# Patient Record
Sex: Male | Born: 1965
Health system: Southern US, Community
[De-identification: ages and names within clinical notes are randomized; demographics above are authoritative.]

## PROBLEM LIST (undated history)

## (undated) DIAGNOSIS — Z789 Other specified health status: Secondary | ICD-10-CM

## (undated) DIAGNOSIS — F319 Bipolar disorder, unspecified: Secondary | ICD-10-CM

## (undated) DIAGNOSIS — F209 Schizophrenia, unspecified: Secondary | ICD-10-CM

## (undated) HISTORY — PX: TONSILLECTOMY: SUR1361

## (undated) HISTORY — PX: MOUTH SURGERY: SHX715

## (undated) HISTORY — DX: Other specified health status: Z78.9

---

## 1997-11-27 ENCOUNTER — Emergency Department (HOSPITAL_COMMUNITY): Admission: EM | Admit: 1997-11-27 | Discharge: 1997-11-27 | Payer: Self-pay | Admitting: Emergency Medicine

## 1998-02-04 ENCOUNTER — Emergency Department (HOSPITAL_COMMUNITY): Admission: EM | Admit: 1998-02-04 | Discharge: 1998-02-04 | Payer: Self-pay | Admitting: Emergency Medicine

## 1998-05-14 ENCOUNTER — Encounter: Payer: Self-pay | Admitting: Emergency Medicine

## 1998-05-14 ENCOUNTER — Emergency Department (HOSPITAL_COMMUNITY): Admission: EM | Admit: 1998-05-14 | Discharge: 1998-05-14 | Payer: Self-pay | Admitting: Emergency Medicine

## 1998-07-01 ENCOUNTER — Emergency Department (HOSPITAL_COMMUNITY): Admission: EM | Admit: 1998-07-01 | Discharge: 1998-07-01 | Payer: Self-pay | Admitting: Emergency Medicine

## 1998-08-05 ENCOUNTER — Emergency Department (HOSPITAL_COMMUNITY): Admission: EM | Admit: 1998-08-05 | Discharge: 1998-08-05 | Payer: Self-pay | Admitting: Emergency Medicine

## 1998-08-05 ENCOUNTER — Encounter: Payer: Self-pay | Admitting: Emergency Medicine

## 1998-08-15 ENCOUNTER — Emergency Department (HOSPITAL_COMMUNITY): Admission: EM | Admit: 1998-08-15 | Discharge: 1998-08-15 | Payer: Self-pay | Admitting: Emergency Medicine

## 1998-08-22 ENCOUNTER — Emergency Department (HOSPITAL_COMMUNITY): Admission: EM | Admit: 1998-08-22 | Discharge: 1998-08-22 | Payer: Self-pay | Admitting: Emergency Medicine

## 1998-10-25 ENCOUNTER — Emergency Department (HOSPITAL_COMMUNITY): Admission: EM | Admit: 1998-10-25 | Discharge: 1998-10-25 | Payer: Self-pay | Admitting: Emergency Medicine

## 1998-12-16 ENCOUNTER — Emergency Department (HOSPITAL_COMMUNITY): Admission: EM | Admit: 1998-12-16 | Discharge: 1998-12-16 | Payer: Self-pay | Admitting: Emergency Medicine

## 1999-09-07 ENCOUNTER — Emergency Department (HOSPITAL_COMMUNITY): Admission: EM | Admit: 1999-09-07 | Discharge: 1999-09-07 | Payer: Self-pay | Admitting: Emergency Medicine

## 1999-10-08 ENCOUNTER — Emergency Department (HOSPITAL_COMMUNITY): Admission: EM | Admit: 1999-10-08 | Discharge: 1999-10-08 | Payer: Self-pay | Admitting: Emergency Medicine

## 1999-10-08 ENCOUNTER — Encounter: Payer: Self-pay | Admitting: Emergency Medicine

## 2000-01-30 ENCOUNTER — Emergency Department (HOSPITAL_COMMUNITY): Admission: EM | Admit: 2000-01-30 | Discharge: 2000-01-30 | Payer: Self-pay | Admitting: Emergency Medicine

## 2000-01-30 ENCOUNTER — Encounter: Payer: Self-pay | Admitting: Emergency Medicine

## 2000-02-05 ENCOUNTER — Inpatient Hospital Stay (HOSPITAL_COMMUNITY): Admission: EM | Admit: 2000-02-05 | Discharge: 2000-02-11 | Payer: Self-pay | Admitting: *Deleted

## 2000-07-15 ENCOUNTER — Emergency Department (HOSPITAL_COMMUNITY): Admission: EM | Admit: 2000-07-15 | Discharge: 2000-07-15 | Payer: Self-pay | Admitting: Emergency Medicine

## 2000-10-04 ENCOUNTER — Emergency Department (HOSPITAL_COMMUNITY): Admission: EM | Admit: 2000-10-04 | Discharge: 2000-10-04 | Payer: Self-pay | Admitting: Emergency Medicine

## 2001-07-07 ENCOUNTER — Emergency Department (HOSPITAL_COMMUNITY): Admission: EM | Admit: 2001-07-07 | Discharge: 2001-07-08 | Payer: Self-pay

## 2002-02-08 ENCOUNTER — Emergency Department (HOSPITAL_COMMUNITY): Admission: EM | Admit: 2002-02-08 | Discharge: 2002-02-08 | Payer: Self-pay | Admitting: Emergency Medicine

## 2005-02-11 ENCOUNTER — Emergency Department (HOSPITAL_COMMUNITY): Admission: EM | Admit: 2005-02-11 | Discharge: 2005-02-11 | Payer: Self-pay | Admitting: Emergency Medicine

## 2005-08-18 ENCOUNTER — Emergency Department (HOSPITAL_COMMUNITY): Admission: EM | Admit: 2005-08-18 | Discharge: 2005-08-18 | Payer: Self-pay | Admitting: Emergency Medicine

## 2006-08-25 ENCOUNTER — Emergency Department (HOSPITAL_COMMUNITY): Admission: EM | Admit: 2006-08-25 | Discharge: 2006-08-25 | Payer: Self-pay | Admitting: Emergency Medicine

## 2006-10-18 ENCOUNTER — Emergency Department (HOSPITAL_COMMUNITY): Admission: EM | Admit: 2006-10-18 | Discharge: 2006-10-18 | Payer: Self-pay | Admitting: Emergency Medicine

## 2007-06-05 ENCOUNTER — Emergency Department (HOSPITAL_COMMUNITY): Admission: EM | Admit: 2007-06-05 | Discharge: 2007-06-05 | Payer: Self-pay | Admitting: Emergency Medicine

## 2008-05-16 ENCOUNTER — Emergency Department (HOSPITAL_COMMUNITY): Admission: EM | Admit: 2008-05-16 | Discharge: 2008-05-16 | Payer: Self-pay | Admitting: Emergency Medicine

## 2008-05-20 ENCOUNTER — Emergency Department (HOSPITAL_COMMUNITY): Admission: EM | Admit: 2008-05-20 | Discharge: 2008-05-20 | Payer: Self-pay | Admitting: Emergency Medicine

## 2008-07-01 ENCOUNTER — Emergency Department (HOSPITAL_COMMUNITY): Admission: EM | Admit: 2008-07-01 | Discharge: 2008-07-01 | Payer: Self-pay | Admitting: Emergency Medicine

## 2010-10-15 LAB — GLUCOSE, CAPILLARY: Glucose-Capillary: 132 mg/dL — ABNORMAL HIGH (ref 70–99)

## 2010-11-16 NOTE — H&P (Signed)
[facility]  Patient:    Blake Gray                        MRN: [id number] Adm. Date:  [id number] Attending:  Denny Gray Dictator:   Blake Gray, N.P.                         History and Physical  IDENTIFYING INFORMATION:  [name] is a 45 year old African-American single male admitted under voluntary admission, February 05, 2000, on referral by [facility] for using cocaine, alcohol and also having command hallucinations.  HISTORY OF PRESENT ILLNESS:  Patient apparently is treated for paranoid schizophrenia at the [facility].  Patient has been noncompliant with his Haldol D injections.  His last shot, as noted on the chart, was May 2001.  He reports that he was having command hallucinations telling him to jump off a bridge, to steal or to get one, meaning to get more crack cocaine.  He states he currently is not suicidal or homicidal.  He last heard the voices early this morning.  He reports he is able to contract for safety and get a nurse, should he need one.  He states his sleep has been fairly good.  Appetite:  He eats once a day.  Weight loss of 10 pounds in the last month.  He states he is walking a lot and has been using crack cocaine. Decreased energy.  He used $400 worth of crack cocaine on this past Friday. He uses cocaine every day; he last used February 02, 2000.  He also drinks a 40-ounce beer every day and this has been going on for 3 to 4 months; his last drink was Monday.  He has no history of DTs, no history of seizures, no history of blackouts.  Again, he last heard voices this morning.  He denies current paranoia.  He states that "the voices do know my mind."  PAST PSYCHIATRIC HISTORY:  Patient goes to the [facility] and sees Blake Gray and Blake Gray.  He has been going there two or three years.  Hospitalizations include [facility]  in 1993 for substance abuse and [facility] x 2, approximately two years ago.  He did have one suicide attempt three years ago; he tried to drown himself when he was in prison.  PAST MEDICAL HISTORY:  Patient states his primary care doctor is Blake Gray. Blount; however, he has not seen him in years.  He goes to [facility] ED when he has any medical problems.  Medical problems include sexual dysfunction.  There was written on the ACT team assessment some question of patient having a CVA two years ago; this is not verified.  MEDICATIONS:  Haldol D injection, dose unknown.  He last received it February 05, 2000 at mental health.  DRUG ALLERGIES:  ASPIRIN, which gives him a rash.  SOCIAL HISTORY:  Patient is single.  He lives in an apartment with a roommate. Parents are deceased.  He has one sister, two stepbrothers and one stepsister; he states he is very close to them.  He completed the 11th grade.  He does not work, he is disabled and he states he has mild MR.  He is on Medicare.  He was in prison about three years ago for armed robbery; he served approximately eight months.  He is  no longer on probation.  He said that part of his life is over with.  FAMILY HISTORY:  Maternal aunt had "schizophrenia."  ALCOHOL/DRUG HISTORY:  Drinking since the age of 88.  He has been using crack cocaine since age 31 or 57.  He uses marijuana whenever he can get it but it is only occasionally.  He does not smoke.  POSITIVE PHYSICAL FINDINGS:  His physical is pending.  His CMET was within normal limits.  His blood alcohol level was less than 10.  Urine drug screen was positive for cocaine.  His CBC with differential was within normal limits. His temperature was 98.1, pulse 73, respirations 20, blood pressure 112/77 and his height is 5 feet 5 inches.  He weighs 175 pounds.  MENTAL STATUS EXAMINATION:  A casually dressed African-American male who is cooperative and pleasant.  Speech is low but  relevant.  Mood:  Slightly anxious.  His affect is blunted.  He denies suicidal ideation.  He denies homicidal ideation.  Thought processes:  He is having auditory hallucinations that sometimes tell him what to do but he states he is able to control this. He denies visual hallucinations.  No paranoia noted.  Cognitive:  He is alert and oriented.  His cognitive functioning is consistent with his level of intellectual functioning.  CURRENT DIAGNOSES Axes I:    1. Paranoid schizophrenia.            2. Cocaine abuse.            3. Alcohol dependence. Axis II:   Mild mental retardation (per client). Axis III:  None. Axis IV:   Severe, related to problems with primary support group and social            environment and economic problems in dealing with his drug abuse,            as well as schizophrenia. Axis V:    Current global assessment of functioning is 35; highest in the past            year is 60.  TREATMENT PLAN AND RECOMMENDATIONS:  Voluntary admission to [facility].  Our goal will be to check every 15 minutes, maintain safety and patient is able to contract for safety and reports he will go to a nurse should he have any command hallucinations.  He received Haldol D injection at the [facility] yesterday, February 05, 2000; the dose he received is unknown.  We will detox him using the high-dose Librium protocol.  Patient is to attend dual-diagnosis groups and we will just add Risperdal 0.5 mg h.s. p.o. due to his continuing hallucinations and have the patient attend the substance abuse groups.  TENTATIVE LENGTH OF STAY AND DISCHARGE PLAN:  Three days. DD:  02/06/00 TD:  02/07/00 Job: [id number]

## 2010-11-16 NOTE — H&P (Signed)
[facility]  Patient:    Blake Gray, Blake Gray                        MRN: [id number] Adm. Date:  [id number] Disc. Date: [id number] Attending:  Denny Peon Dictator:   Valinda Hoar, N.P.                         History and Physical  REVIEW OF SYSTEMS:  CARDIAC:  The patient denies any problems, no hypertension, no angina or any type of problems according to patient. PULMONARY:  Denies any problems, no shortness of breath, recent upper respiratory tract infection, no asthma, no COPD.  NEUROLOGIC:  Denies any problems.  No history of seizures, no history of falling.  No confusion. HEMATOLOGY:  The patient denied sickle cell trait, no anemia or bleeding disorder.  ENDOCRINE:  The patient denies any thyroid, diabetes or any problems.  GI:  The patient states he has no problems, no constipation.  GU: The patient denied any problems, no urinary frequency, urgency, incontinence, hematuria or nocturia noted.  MUSCULOSKELETAL:  The patient denied any problems.  No joint pain, no joint swelling.  ENT:  The patient states he sees well, no impaired vision and no hearing problems.   PREVENTIVE CARE:  Unknown. SKIN/MUCOSA:  The patient denies any problems, denies a rash, edema. PAIN: Denies any pain.  SLEEP:  He said he was sleeping okay.   NUTRITION:  He states he eats well.  This information is given to [place] by this patient, I am unsure of its reliability.  PHYSICAL EXAMINATION:  VITAL SIGNS:  Temperature 97.5, pulse 92, respirations 20, blood pressure 105/78, height 5 feet 5 inches, weight 176 pounds.  GENERAL APPEARANCE:  The patient is a 45 year old  African-American male sitting on the exam table in no acute distress.  He is well-developed and average in stature and appears his stated age.  He is unkempt.  HEAD:  Normocephalic, atraumatic.  Can raise eyebrows.  EYES:  Pupils PERRLA, EOM intact bilaterally, direct and consensual. Funduscopic exam within  normal limits.  His eyes are red and appear to be bloodshot.  ENT/MOUTH:  Difficult to visualize tympanic membranes due to buildup of cerumen.  Nostrils are patent bilaterally.  No sinus tenderness.  Mouth: Mucosa is moist, poor dentition.  No lesions seen or palpated on tongue. Tongue protrudes midline without tremors.  Can clench teeth and puff out cheeks.  No pharyngeal hyperemia or exudate noted.  NECK:  Supple, full range of motion, no lymphadenopathy.  Thyroid nonpalpable, nontender, not enlarged.  LUNGS:  Clear to auscultation without adventitious sounds.  Denies cough.  CARDIAC:  Regular rate and rhythm without murmurs.  CHEST:  Breasts symmetrical.  ABDOMEN:  Inspection reveals a protuberant, soft, nontender abdomen.  No masses, organomegaly or rebound tenderness.  Active bowel sounds in all four quadrants.  No CVA tenderness.  LYMPH:  No lymphadenopathy.  MUSCULOSKELETAL:  No joint swelling or deformity.  Gait is normal. Good range of motion.  Muscle strength and tone are equal bilaterally.  SKIN:  Warm and dry.  NEUROLOGICAL:  He is oriented x 3.  Cranial nerves grossly intact.  Deep tendon reflexes 1/2+ equal and adequate in upper and lower extremities and good grip strength bilaterally with no involuntary movement.  Cerebellar function intact with finger-to-finger, heel-to-shin, normal alternating movements.  Romberg is negative. DD:  02/11/00 TD:  02/12/00 Job: [id number]

## 2010-11-16 NOTE — Discharge Summary (Signed)
[facility]  Patient:    Blake Gray, Blake Gray                        MRN: [id number] Adm. Date:  [id number] Disc. Date: [id number] Attending:  Denny Peon                           Discharge Summary  INTRODUCTION:  Blake Gray is a 45 year old black single male who was admitted on a voluntary basis from [facility] for detoxification from alcohol and experiencing auditory command type hallucinations.  HISTORY OF PRESENT ILLNESS:  The patient has a long history of being treated for paranoid schizophrenia and he was noncompliant with Haldol Decanoate injection.  The day prior to admission he had his shot of Haldol Decanoate on February 05, 2000, but the previous dose was done in May.  Details of the patients history available in admission note; patient had no major health problems.  HOSPITAL COURSE:  After admission to the ward, the patient was placed on special observation and detoxed with Librium, was introduced.  The patient tolerated the Librium okay with no side effects.  He did not have any significant signs of withdrawal.  In spite of the recent dose of Haldol, he still experienced auditory hallucinations, we introduced Risperdal 0.5 mg at bedtime, which was later increased to 0.5 mg twice a day.  We also started Pamelor for signs of cocaine abuse withdrawal and craving and started with Celexa 10 mg daily for signs of depression.  Gradually, the patient did improve.  Hallucinations disappeared.  He gained some insight into his illness and was willing to follow the recommendation of inpatient rehabilitation program.  MEDICAL PROBLEMS:  Patients physical examination was normal.  CBC and differential was normal. T3, T4 and TSH were within normal limits with the exception of very slight elevation of T3 uptake 37.9, with normal up to 37%.  Comprehensive metabolic panel showed normal result.  Drug screen was negative for  cocaine.  Vital signs were stable throughout the hospitalization. The patient ran a low grade fever on February 06, 2000 and February 07, 2000 but later his temperature normalized.  Pulse was 80, blood pressure 105/78, respiration rate 20 on February 19, 2000, at the time of discharge.  At the time of discharge the patient presented with a pleasant affect, denied hallucinations, delusions, dangerous ideations.  He felt ready for discharge and for substance abuse treatment.  DISCHARGE DIAGNOSES: Axis I:   1. Schizophrenia, paranoid type, chronic.           2. Cocaine abuse.           3. Alcohol dependence. Axis II:  Borderline intellectual functioning. Axis III: No diagnosis. Axis IV:  Psychosocial stressors severe, related to chronic mental illness and           lack of support. Axis V:   GAF on admission 35, upon discharge 55, in past year 60.  DISCHARGE RECOMMENDATION:  Patient is going to be discharged to [facility].  He received prescription for the following medications: Risperdal 0.5 mg at noon and bedtime, Pamelor 25 mg at bedtime, Celexa 20 mg 1/2 tablet and in the morning, Haldol Decanoate 50 mg every four weeks, next injection March 05, 2000 at [facility] at a dose of 50 mg a month, which has to be confirmed by [facility] since  the patient did not remember the exact dose.  Patient after discharge from rehabilitation program will follow with [facility].  Prognosis is guarded.  Providing that patient is compliant with medication and abstains from substance abuse, he could be maintained in good condition for years to come. DD:  02/11/00 TD:  02/11/00 Job: [id number]

## 2011-04-24 ENCOUNTER — Emergency Department (HOSPITAL_COMMUNITY)
Admission: EM | Admit: 2011-04-24 | Discharge: 2011-04-24 | Disposition: A | Discharge status: Home / Self Care | Payer: Medicare Other | Attending: Emergency Medicine | Admitting: Emergency Medicine

## 2011-04-24 DIAGNOSIS — Z79899 Other long term (current) drug therapy: Secondary | ICD-10-CM | POA: Insufficient documentation

## 2011-04-24 DIAGNOSIS — Z8659 Personal history of other mental and behavioral disorders: Secondary | ICD-10-CM | POA: Insufficient documentation

## 2011-04-24 DIAGNOSIS — R51 Headache: Secondary | ICD-10-CM | POA: Insufficient documentation

## 2011-04-24 LAB — POCT I-STAT, CHEM 8
Calcium, Ion: 1.25 mmol/L (ref 1.12–1.32)
Creatinine, Ser: 1.5 mg/dL — ABNORMAL HIGH (ref 0.50–1.35)
Glucose, Bld: 93 mg/dL (ref 70–99)
HCT: 46 % (ref 39.0–52.0)

## 2011-08-29 ENCOUNTER — Emergency Department (HOSPITAL_COMMUNITY)
Admission: EM | Admit: 2011-08-29 | Discharge: 2011-08-29 | Disposition: A | Discharge status: Home Health | Payer: Medicare Other | Attending: Emergency Medicine | Admitting: Emergency Medicine

## 2011-08-29 ENCOUNTER — Emergency Department (HOSPITAL_COMMUNITY): Payer: Medicare Other

## 2011-08-29 ENCOUNTER — Encounter (HOSPITAL_COMMUNITY): Payer: Self-pay | Admitting: Emergency Medicine

## 2011-08-29 DIAGNOSIS — R05 Cough: Secondary | ICD-10-CM | POA: Insufficient documentation

## 2011-08-29 DIAGNOSIS — R059 Cough, unspecified: Secondary | ICD-10-CM | POA: Insufficient documentation

## 2011-08-29 DIAGNOSIS — J069 Acute upper respiratory infection, unspecified: Secondary | ICD-10-CM | POA: Insufficient documentation

## 2011-08-29 MED ORDER — ALBUTEROL SULFATE HFA 108 (90 BASE) MCG/ACT IN AERS
2.0000 | INHALATION_SPRAY | RESPIRATORY_TRACT | Status: DC | PRN
  Administered 2011-08-29: 2 via RESPIRATORY_TRACT
  Filled 2011-08-29: qty 6.7

## 2011-08-29 NOTE — ED Notes (Signed)
Cough and cold since last week

## 2011-08-29 NOTE — Discharge Instructions (Signed)
RESOURCE GUIDE  Dental Problems  Patients with Medicaid: [facility]                     [facility] [address]                                           [address] Phone:  [phone]                                                  Phone:  [phone]  If unable to pay or uninsured, contact:  [facility] or [facility] to become qualified for the adult dental clinic.  Chronic Pain Problems Contact [facility]  [phone] Patients need to be referred by their primary care doctor.  Insufficient Money for Medicine Contact [employer]:  call "211" or [facility] 271-5999.  No Primary Care Doctor Call [facility]  832-8000 Other agencies that provide inexpensive medical care    [facility]  832-8035    [facility]  832-7272    [facility]  [phone]    Women's Clinic  832-4777    [facility]  [phone]    [facility]  272-1050  Psychological Services [facility]  [phone] [facility]  [phone] [facility]   800 853-5163 (emergency services 641-4993)  Substance Abuse Resources [facility]  336-882-2125 [facility] 336-784-9470 [facility] 336-285-9073 [facility] 336-845-3988 Residential & Outpatient Substance Abuse Program  800-659-3381  Abuse/Neglect [place] Child Abuse Hotline (336) 641-3795 [place] Child Abuse Hotline 800-378-5315 (After Hours)  Emergency Shelter [facility] (336) 271-5985  M[facility] (336) 275-9566 [facility] (704) 372-4663  MRSA Hotline #:   832-7006    [place] Resources  [facility] [place]     [employer]                          [facility] [address] [place]                       [address]      [address]  [place]                                                [place]                            [place] Phone:  [phone]                                   Phone:  [phone]                 Phone:  [phone]  [facility] Phone:  342-8316    [facility] Child Abuse Hotline (218)333-3859 8026824317 (After Hours)     Cool Mist Vaporizers Vaporizers may help relieve the symptoms of a cough and cold. By adding water to the air, mucus may become thinner and less sticky. This makes it easier to breathe and cough up secretions. Vaporizers have not been proven to show they help with colds. You should not use a vaporizer if you are allergic to mold. Cool mist vaporizers do not cause serious burns like hot mist vaporizers ("steamers"). HOME CARE INSTRUCTIONS  Follow the package instructions for your vaporizer.   Use a vaporizer that holds a large volume of water (1 to 2 gallons [5.7 to 7.5 liters]).   Do not use anything other than distilled water in the vaporizer.   Do not run the vaporizer all of the time. This can cause mold or bacteria to grow in the vaporizer.   Clean the vaporizer after each time you use it.   Clean and dry the vaporizer well before you store it.   Stop using a vaporizer if you develop worsening respiratory symptoms.  Document Released: 03/14/2004 Document Revised: 02/27/2011 Document Reviewed: 02/09/2009 [facility] Patient Information 2012 [place], [place].Upper Respiratory Infection, Adult An upper respiratory infection (URI) is also sometimes known as the common cold. The upper respiratory tract includes the nose, sinuses, throat, trachea, and bronchi. Bronchi are the airways leading to the lungs. Most people improve within 1 week, but symptoms can last up to 2 weeks. A residual cough may last even longer.  CAUSES Many different viruses can infect the tissues lining the upper respiratory tract. The tissues become  irritated and inflamed and often become very moist. Mucus production is also common. A cold is contagious. You can easily spread the virus to others by oral contact. This includes kissing, sharing a glass, coughing, or sneezing. Touching your mouth or nose and then touching a surface, which is then touched by another person, can also spread the virus. SYMPTOMS  Symptoms typically develop 1 to 3 days after you come in contact with a cold virus. Symptoms vary from person to person. They may include:  Runny nose.   Sneezing.   Nasal congestion.   Sinus irritation.   Sore throat.   Loss of voice (laryngitis).   Cough.   Fatigue.   Muscle aches.   Loss of appetite.   Headache.   Low-grade fever.  DIAGNOSIS  You might diagnose your own cold based on familiar symptoms, since most people get a cold 2 to 3 times a year. Your caregiver can confirm this based on your exam. Most importantly, your caregiver can check that your symptoms are not due to another disease such as strep throat, sinusitis, pneumonia, asthma, or epiglottitis. Blood tests, throat tests, and X-rays are not necessary to diagnose a common cold, but they may sometimes be helpful in excluding other more serious diseases. Your caregiver will decide if any further tests are required. RISKS AND COMPLICATIONS  You may be at risk for a more severe case of the common cold if you smoke cigarettes, have chronic heart disease (such as heart failure) or lung disease (such as asthma), or if you have a weakened immune system. The very young and very old are also at risk for more serious infections. Bacterial sinusitis, middle ear infections, and bacterial pneumonia can complicate the common cold. The common cold can worsen asthma and chronic obstructive pulmonary disease (COPD). Sometimes, these complications can require emergency medical care and may be  life-threatening. PREVENTION  The best way to protect against getting a cold is to  practice good hygiene. Avoid oral or hand contact with people with cold symptoms. Wash your hands often if contact occurs. There is no clear evidence that vitamin C, vitamin E, echinacea, or exercise reduces the chance of developing a cold. However, it is always recommended to get plenty of rest and practice good nutrition. TREATMENT  Treatment is directed at relieving symptoms. There is no cure. Antibiotics are not effective, because the infection is caused by a virus, not by bacteria. Treatment may include:  Increased fluid intake. Sports drinks offer valuable electrolytes, sugars, and fluids.   Breathing heated mist or steam (vaporizer or shower).   Eating chicken soup or other clear broths, and maintaining good nutrition.   Getting plenty of rest.   Using gargles or lozenges for comfort.   Controlling fevers with ibuprofen or acetaminophen as directed by your caregiver.   Increasing usage of your inhaler if you have asthma.  Zinc gel and zinc lozenges, taken in the first 24 hours of the common cold, can shorten the duration and lessen the severity of symptoms. Pain medicines may help with fever, muscle aches, and throat pain. A variety of non-prescription medicines are available to treat congestion and runny nose. Your caregiver can make recommendations and may suggest nasal or lung inhalers for other symptoms.  HOME CARE INSTRUCTIONS   Only take over-the-counter or prescription medicines for pain, discomfort, or fever as directed by your caregiver.   Use a warm mist humidifier or inhale steam from a shower to increase air moisture. This may keep secretions moist and make it easier to breathe.   Drink enough water and fluids to keep your urine clear or pale yellow.   Rest as needed.   Return to work when your temperature has returned to normal or as your caregiver advises. You may need to stay home longer to avoid infecting others. You can also use a face mask and careful hand washing  to prevent spread of the virus.  SEEK MEDICAL CARE IF:   After the first few days, you feel you are getting worse rather than better.   You need your caregiver's advice about medicines to control symptoms.   You develop chills, worsening shortness of breath, or brown or red sputum. These may be signs of pneumonia.   You develop yellow or brown nasal discharge or pain in the face, especially when you bend forward. These may be signs of sinusitis.   You develop a fever, swollen neck glands, pain with swallowing, or white areas in the back of your throat. These may be signs of strep throat.  SEEK IMMEDIATE MEDICAL CARE IF:   You have a fever.   You develop severe or persistent headache, ear pain, sinus pain, or chest pain.   You develop wheezing, a prolonged cough, cough up blood, or have a change in your usual mucus (if you have chronic lung disease).   You develop sore muscles or a stiff neck.  Document Released: 12/11/2000 Document Revised: 02/27/2011 Document Reviewed: 10/19/2010 [facility] Patient Information 2012 [place], [place].

## 2011-08-29 NOTE — ED Provider Notes (Signed)
Medical screening examination/treatment/procedure(s) were performed by non-physician practitioner and as supervising physician I was immediately available for consultation/collaboration.   Rolan Bucco, MD 08/29/11 1538

## 2011-08-29 NOTE — ED Provider Notes (Signed)
History     CSN: [id number]  Arrival date & time 08/29/11  0919   First MD Initiated Contact with Patient 08/29/11 [phone]      Chief Complaint  Patient presents with  . Cough    (Consider location/radiation/quality/duration/timing/severity/associated sxs/prior treatment) HPI  Pt presents to the ED with complaints of URI symptoms. Upper Respiratory Infection: Patient complains of symptoms of a URI. Symptoms include congestion and cough. Onset of symptoms was 1 week ago, unchanged since that time. He also c/o non productive cough for the past 3 days .  He is drinking plenty of fluids. Evaluation to date: none. Treatment to date: none. He denies N/V/D fevers. Pt denies having significant PMH of diabetes, asthma, recent illness, hypertension or high cholesterol.      History reviewed. No pertinent past medical history.  No past surgical history on file.  No family history on file.  History  Substance Use Topics  . Smoking status: Never Smoker   . Smokeless tobacco: Not on file  . Alcohol Use: Yes      Review of Systems  All other systems reviewed and are negative.    Allergies  Aspirin  Home Medications   Current Outpatient Rx  Name Route Sig Dispense Refill  . NYQUIL PO Oral Take 10 mLs by mouth once as needed. For cold symptoms      BP 112/74  Pulse 76  Temp(Src) 97.8 F (36.6 C) (Oral)  Resp 22  SpO2 98%  Physical Exam  Nursing note and vitals reviewed. Constitutional: He appears well-developed and well-nourished. No distress.  HENT:  Head: Normocephalic and atraumatic.  Eyes: Pupils are equal, round, and reactive to light.  Neck: Normal range of motion. Neck supple.  Cardiovascular: Normal rate and regular rhythm.   Pulmonary/Chest: Effort normal and breath sounds normal. No respiratory distress. He has no wheezes. He has no rales. He exhibits no tenderness.  Abdominal: Soft.  Neurological: He is alert.  Skin: Skin is warm and dry.    ED Course    Procedures (including critical care time)  Labs Reviewed - No data to display Dg Chest 2 View  08/29/2011  *RADIOLOGY REPORT*  Clinical Data: Cough and chest congestion.  CHEST - 2 VIEW  Comparison: 08/18/2005  Findings: The patient has slight peribronchial thickening suggestive of mild bronchitis.  Lungs are otherwise clear.  Heart size and vascularity are normal.  No osseous abnormality.  There is slight narrowing of the trachea just above the thoracic inlet which I suspect is due to enlargement of the thyroid gland.  IMPRESSION:  1.  Mild bronchitic changes. 2.  Probable enlargement of the thyroid gland.  Original Report Authenticated By: Gwynn Burly, M.D.     1. URI (upper respiratory infection)       MDM  Chest xray shows some enlargment of the patients Thyroid gland. The patient is not showing signs of a toxic thyroid in ED. I have discussed with the patient these results and the need for a follow-up appointment with PCP. I will refer [facility] to [facility] for [facility] management. Pt given Albuterol inhaler in ED for cough.        Dorthula Matas, PA 08/29/11 1043

## 2011-08-29 NOTE — ED Notes (Signed)
Patient transported to X-ray by [facility]

## 2011-11-26 DIAGNOSIS — E079 Disorder of thyroid, unspecified: Secondary | ICD-10-CM | POA: Insufficient documentation

## 2011-11-26 DIAGNOSIS — Z79899 Other long term (current) drug therapy: Secondary | ICD-10-CM | POA: Insufficient documentation

## 2011-11-26 DIAGNOSIS — F79 Unspecified intellectual disabilities: Secondary | ICD-10-CM | POA: Insufficient documentation

## 2012-10-25 ENCOUNTER — Emergency Department (HOSPITAL_COMMUNITY)
Admission: EM | Admit: 2012-10-25 | Discharge: 2012-10-25 | Disposition: A | Discharge status: Home Health | Payer: Medicare Other | Attending: Emergency Medicine | Admitting: Emergency Medicine

## 2012-10-25 ENCOUNTER — Encounter (HOSPITAL_COMMUNITY): Payer: Self-pay | Admitting: Emergency Medicine

## 2012-10-25 DIAGNOSIS — J309 Allergic rhinitis, unspecified: Secondary | ICD-10-CM | POA: Insufficient documentation

## 2012-10-25 DIAGNOSIS — J069 Acute upper respiratory infection, unspecified: Secondary | ICD-10-CM | POA: Insufficient documentation

## 2012-10-25 DIAGNOSIS — Z79899 Other long term (current) drug therapy: Secondary | ICD-10-CM | POA: Insufficient documentation

## 2012-10-25 DIAGNOSIS — R6889 Other general symptoms and signs: Secondary | ICD-10-CM | POA: Insufficient documentation

## 2012-10-25 MED ORDER — PROMETHAZINE-DM 6.25-15 MG/5ML PO SYRP: 5.0000 mL | ORAL_SOLUTION | Freq: Four times a day (QID) | ORAL | Status: DC | PRN

## 2012-10-25 MED ORDER — PREDNISONE 50 MG PO TABS: 50.0000 mg | ORAL_TABLET | Freq: Every day | ORAL | Status: DC

## 2012-10-25 MED ORDER — GUAIFENESIN ER 1200 MG PO TB12: 1.0000 | ORAL_TABLET | Freq: Two times a day (BID) | ORAL | Status: DC

## 2012-10-25 MED ORDER — ACETAMINOPHEN-CODEINE 120-12 MG/5ML PO SOLN
5.0000 mL | Freq: Once | ORAL | Status: AC
  Administered 2012-10-25: 10:00:00 via ORAL
  Filled 2012-10-25: qty 10

## 2012-10-25 MED ORDER — DEXAMETHASONE SODIUM PHOSPHATE 10 MG/ML IJ SOLN
10.0000 mg | Freq: Once | INTRAMUSCULAR | Status: AC
  Administered 2012-10-25: 10 mg via INTRAMUSCULAR
  Filled 2012-10-25: qty 1

## 2012-10-25 NOTE — ED Notes (Signed)
Pt discharged to home with family. NAD.  

## 2012-10-25 NOTE — ED Notes (Signed)
Pt presents to ED with c/o of cough. NAD.

## 2012-10-25 NOTE — ED Provider Notes (Signed)
History     CSN: [id number]  Arrival date & time 10/25/12  3244   First MD Initiated Contact with Patient 10/25/12 0920      Chief Complaint  Patient presents with  . Cough    (Consider location/radiation/quality/duration/timing/severity/associated sxs/prior treatment) HPI Patient presents emergency department with cough, runny nose, watery eyes and sneezing.  Patient, states, that the cough has been worse over the last 24.  Patient denies nausea, vomiting, chest pain, shortness of breath, wheezing, headache, blurred vision, dizziness, syncope, fever, abdominal pain, or diarrhea.  Patient, states he took some NyQuil last night, with minimal relief.  Patient denies any aggravating or reliving factors      History reviewed. No pertinent past medical history.  History reviewed. No pertinent past surgical history.  History reviewed. No pertinent family history.  History  Substance Use Topics  . Smoking status: Never Smoker   . Smokeless tobacco: Not on file  . Alcohol Use: Yes      Review of Systems All other systems negative except as documented in the HPI. All pertinent positives and negatives as reviewed in the HPI. Allergies  Aspirin  Home Medications   Current Outpatient Rx  Name  Route  Sig  Dispense  Refill  . Pseudoeph-Doxylamine-DM-APAP (NYQUIL PO)   Oral   Take 10 mLs by mouth once as needed. For cold symptoms         . Guaifenesin 1200 MG TB12   Oral   Take 1 tablet (1,200 mg total) by mouth 2 (two) times daily.   20 each   0   . predniSONE (DELTASONE) 50 MG tablet   Oral   Take 1 tablet (50 mg total) by mouth daily.   5 tablet   0   . promethazine-dextromethorphan (PROMETHAZINE-DM) 6.25-15 MG/5ML syrup   Oral   Take 5 mLs by mouth 4 (four) times daily as needed for cough.   120 mL   0     BP 132/83  Pulse 78  Temp(Src) 97.7 F (36.5 C) (Oral)  Resp 18  SpO2 95%  Physical Exam  Nursing note and vitals reviewed. Constitutional: He  appears well-developed and well-nourished. No distress.  HENT:  Head: Normocephalic and atraumatic.  Mouth/Throat: Oropharynx is clear and moist.  Eyes: Pupils are equal, round, and reactive to light.  Neck: Normal range of motion. Neck supple.  Cardiovascular: Normal rate, regular rhythm and normal heart sounds.  Exam reveals no gallop and no friction rub.   No murmur heard. Pulmonary/Chest: Effort normal and breath sounds normal. No respiratory distress. He has no wheezes.  Abdominal: Soft. Bowel sounds are normal. He exhibits no distension.  Skin: Skin is warm and dry. No rash noted.    ED Course  Procedures (including critical care time)  Labs Reviewed - No data to display No results found.   1. URI (upper respiratory infection)   2. Rhinitis, allergic    Patient is advised to return here as needed.  Told to increase his fluid intake, and follow up with his primary care.  Patient has URI, based on his history and physical exam   MDM        Carlyle Dolly, PA-C 10/25/12 1601

## 2012-10-26 NOTE — ED Provider Notes (Signed)
Medical screening examination/treatment/procedure(s) were performed by non-physician practitioner and as supervising physician I was immediately available for consultation/collaboration.  Keaghan Staton R. Angeletta Goelz, MD 10/26/12 1516 

## 2013-06-28 ENCOUNTER — Encounter (HOSPITAL_COMMUNITY): Payer: Self-pay | Admitting: Emergency Medicine

## 2013-06-28 ENCOUNTER — Emergency Department (HOSPITAL_COMMUNITY)
Admission: EM | Admit: 2013-06-28 | Discharge: 2013-06-28 | Disposition: A | Discharge status: Home / Self Care | Payer: Medicare Other | Attending: Emergency Medicine | Admitting: Emergency Medicine

## 2013-06-28 DIAGNOSIS — R599 Enlarged lymph nodes, unspecified: Secondary | ICD-10-CM | POA: Insufficient documentation

## 2013-06-28 DIAGNOSIS — J069 Acute upper respiratory infection, unspecified: Secondary | ICD-10-CM | POA: Insufficient documentation

## 2013-06-28 DIAGNOSIS — R591 Generalized enlarged lymph nodes: Secondary | ICD-10-CM

## 2013-06-28 NOTE — ED Notes (Signed)
Pt. Stated, i woke up this morning and my neck felt swollen

## 2013-06-28 NOTE — ED Provider Notes (Signed)
Medical screening examination/treatment/procedure(s) were performed by non-physician practitioner and as supervising physician I was immediately available for consultation/collaboration.  EKG Interpretation   None         Charles B. Bernette Mayers, MD 06/28/13 1356

## 2013-06-28 NOTE — ED Provider Notes (Signed)
CSN: [id number]     Arrival date & time 06/28/13  1147 History   First MD Initiated Contact with Patient 06/28/13 1246    This chart was scribed for Clinton Sawyer, a non-physician practitioner working with Leonette Most B. Bernette Mayers, MD by Lewanda Rife, ED Scribe. This patient was seen in room TR04C/TR04C and the patient's care was started at 1:33 PM      Chief Complaint  Patient presents with  . Neck Pain   (Consider location/radiation/quality/duration/timing/severity/associated sxs/prior Treatment) The history is provided by the patient. No language interpreter was used.   HPI Comments: DAIWIK BUFFALO is a 47 y.o. male who presents to the Emergency Department complaining of constant mild anterior neck pain onset noted after waking up this morning. Reports associated anterior neck swelling, and productive cough with green sputum, nasal congestion for the past few days. Denies any aggravating or alleviating factors. Denies associated recent trauma, injury, fever, sore throat, cough, and nasal congestion.  Reports previously evaluated for neck pain and was informed of possible enlarged thyroid gland. States he followed up with Dr. Elveria Rising per pt states "thyroid tests" were unremarkable.   History reviewed. No pertinent past medical history. History reviewed. No pertinent past surgical history. No family history on file. History  Substance Use Topics  . Smoking status: Never Smoker   . Smokeless tobacco: Not on file  . Alcohol Use: Yes    Review of Systems  Constitutional: Negative for fever.  Musculoskeletal: Positive for neck pain.  Psychiatric/Behavioral: Negative for confusion.   A complete 10 system review of systems was obtained and all systems are negative except as noted in the HPI and PMHx.    Allergies  Aspirin  Home Medications   Current Outpatient Rx  Name  Route  Sig  Dispense  Refill  . haloperidol decanoate (HALDOL DECANOATE) 100 MG/ML injection    Intramuscular   Inject 100 mg into the muscle every 28 (twenty-eight) days.          BP 143/83  Pulse 74  Temp(Src) 98 F (36.7 C) (Oral)  Resp 16  Wt 176 lb 11.2 oz (80.151 kg)  SpO2 97% Physical Exam  Nursing note and vitals reviewed. Constitutional: He is oriented to person, place, and time. He appears well-developed and well-nourished. No distress.  HENT:  Head: Normocephalic and atraumatic.  Post nasal drip noted   Eyes: EOM are normal.  Neck: Normal range of motion. Neck supple. No tracheal deviation present. No thyromegaly present.  No meningismus   Cardiovascular: Normal rate and regular rhythm.   Pulmonary/Chest: Effort normal. No stridor. No respiratory distress. He has no wheezes. He has no rales.  Musculoskeletal: Normal range of motion.  Lymphadenopathy:    He has cervical adenopathy.  Neurological: He is alert and oriented to person, place, and time.  Skin: Skin is warm and dry.  Psychiatric: He has a normal mood and affect. His behavior is normal.    ED Course  Procedures COORDINATION OF CARE:  Nursing notes reviewed. Vital signs reviewed. Initial pt interview and examination performed.   1:33 PM-Discussed treatment plan with pt at bedside. Pt agrees with plan.   Treatment plan initiated:Medications - No data to display   Initial diagnostic testing ordered.    Labs Review Labs Reviewed - No data to display Imaging Review No results found.  EKG Interpretation   None       MDM   1. Lymphadenopathy   2. URI (upper respiratory infection)  Pt presenting with neck swelling, concerned it may be his thyroid given thyromegaly in the past. Has associated URI s/s. He is well appearing and in NAD, normal VS. No thyromegaly. Symptomatic tx discussed. Return precautions given. Patient states understanding of treatment care plan and is agreeable.   I personally performed the services described in this documentation, which was scribed in my presence.  The recorded information has been reviewed and is accurate.    Trevor Mace, PA-C 06/28/13 1335

## 2013-07-21 ENCOUNTER — Encounter (HOSPITAL_COMMUNITY): Payer: Self-pay | Admitting: Emergency Medicine

## 2013-07-21 ENCOUNTER — Emergency Department (HOSPITAL_COMMUNITY)
Admission: EM | Admit: 2013-07-21 | Discharge: 2013-07-22 | Disposition: A | Discharge status: Home / Self Care | Payer: Medicare Other | Attending: Emergency Medicine | Admitting: Emergency Medicine

## 2013-07-21 DIAGNOSIS — F10929 Alcohol use, unspecified with intoxication, unspecified: Secondary | ICD-10-CM

## 2013-07-21 DIAGNOSIS — F1010 Alcohol abuse, uncomplicated: Secondary | ICD-10-CM | POA: Insufficient documentation

## 2013-07-21 DIAGNOSIS — F17200 Nicotine dependence, unspecified, uncomplicated: Secondary | ICD-10-CM | POA: Insufficient documentation

## 2013-07-21 DIAGNOSIS — Z79899 Other long term (current) drug therapy: Secondary | ICD-10-CM | POA: Insufficient documentation

## 2013-07-21 LAB — GLUCOSE, CAPILLARY: Glucose-Capillary: 79 mg/dL (ref 70–99)

## 2013-07-21 NOTE — ED Notes (Signed)
Pt ambulatory with steady gait to void in BR.  Denies further needs/complaints at this time.  Nad.

## 2013-07-21 NOTE — ED Notes (Signed)
PT to ER via EMS due to ETOH intoxication; pt denies SI / HI and denies any complaints at present

## 2013-07-21 NOTE — ED Notes (Signed)
Pt observed ambulating in hallway with no difficulty.

## 2013-07-21 NOTE — ED Provider Notes (Signed)
CSN: [id number]     Arrival date & time 07/21/13  2106 History   First MD Initiated Contact with Patient 07/21/13 2302     Chief Complaint  Patient presents with  . Alcohol Intoxication   (Consider location/radiation/quality/duration/timing/severity/associated sxs/prior Treatment) HPI Pt is a 48yo male transported to ED by EMS due to alcohol intoxication. Pt states he has been drinking alcohol tonight. Reports drinking several "40s" earlier tonight. Pt states he called EMS because he "didn't feel well" but states he did not want to come to the ER.  Denies any pain, nausea, or SOB. Denies SI/HI.  No complaints at this time. Pt requesting food and drink. Pt states he lives alone and will need a ride home.  Requesting food and drink.  History reviewed. No pertinent past medical history. History reviewed. No pertinent past surgical history. No family history on file. History  Substance Use Topics  . Smoking status: Current Some Day Smoker  . Smokeless tobacco: Not on file  . Alcohol Use: Yes    Review of Systems  Gastrointestinal: Negative for nausea, vomiting and abdominal pain.  Neurological: Negative for syncope and headaches.  All other systems reviewed and are negative.    Allergies  Aspirin  Home Medications   Current Outpatient Rx  Name  Route  Sig  Dispense  Refill  . benztropine (COGENTIN) 1 MG tablet   Oral   Take 1 mg by mouth daily.          . haloperidol decanoate (HALDOL DECANOATE) 100 MG/ML injection   Intramuscular   Inject 100 mg into the muscle every 28 (twenty-eight) days.          BP 133/80  Pulse 74  Temp(Src) 98.3 F (36.8 C) (Oral)  Resp 16  Ht 5\' 5"  (1.651 m)  Wt 178 lb (80.74 kg)  BMI 29.62 kg/m2  SpO2 99% Physical Exam  Nursing note and vitals reviewed. Constitutional: He is oriented to person, place, and time. He appears well-developed and well-nourished.  Pt lying comfortably on exam bed, alert and oriented.   HENT:  Head:  Normocephalic and atraumatic.  Eyes: Conjunctivae are normal. No scleral icterus.  Neck: Normal range of motion.  Cardiovascular: Normal rate, regular rhythm and normal heart sounds.   Pulmonary/Chest: Effort normal and breath sounds normal. No respiratory distress. He has no wheezes. He has no rales. He exhibits no tenderness.  Abdominal: Soft. Bowel sounds are normal. He exhibits no distension and no mass. There is no tenderness. There is no rebound and no guarding.  Musculoskeletal: Normal range of motion.  Neurological: He is alert and oriented to person, place, and time. He has normal strength. No cranial nerve deficit or sensory deficit. He displays a negative Romberg sign. Coordination and gait normal. GCS eye subscore is 4. GCS verbal subscore is 5. GCS motor subscore is 6.  Skin: Skin is warm and dry.  Psychiatric: He has a normal mood and affect. His behavior is normal.    ED Course  Procedures (including critical care time) Labs Review Labs Reviewed  GLUCOSE, CAPILLARY   Imaging Review No results found.  EKG Interpretation   None       MDM   1. Alcohol intoxication    Pt transported by EMS for ETOH intoxication. Pt admits to drinking earlier tonight, however pt is alert and oriented, steady gait. Vitals: unremarkable.  Will check CBG and give fluid challenge.  Pt likely to be discharged home.   CBG: 79  Pt  appears well, non-toxic. Vitals: unremarkable. Normal neuro exam. Abd exam: unremarkable.   Will discharge pt home. All questions answered and concerns addressed. Will have pt f/u with [facility] and [facility] info provided. Return precautions given. Pt verbalized understanding and agreement with tx plan. Vitals: unremarkable. Discharged in stable condition.            Junius Finner, PA-C 07/22/13 0021

## 2013-07-21 NOTE — ED Notes (Signed)
Notified RN, Merle pt. CBG 79.

## 2013-07-22 NOTE — Discharge Instructions (Signed)
Alcohol Intoxication Alcohol intoxication occurs when you drink enough alcohol that it affects your ability to function. It can be mild or very severe. Drinking a lot of alcohol in a short time is called binge drinking. This can be very harmful. Drinking alcohol can also be more dangerous if you are taking medicines or other drugs. Some of the effects caused by alcohol may include:  Loss of coordination.  Changes in mood and behavior.  Unclear thinking.  Trouble talking (slurred speech).  Throwing up (vomiting).  Confusion.  Slowed breathing.  Twitching and shaking (seizures).  Loss of consciousness. HOME CARE  Do not drive after drinking alcohol.  Drink enough water and fluids to keep your pee (urine) clear or pale yellow. Avoid caffeine.  Only take medicine as told by your doctor. GET HELP IF:  You throw up (vomit) many times.  You do not feel better after a few days.  You frequently have alcohol intoxication. Your doctor can help decide if you should see a substance use treatment counselor. GET HELP RIGHT AWAY IF:  You become shaky when you stop drinking.  You have twitching and shaking.  You throw up blood. It may look bright red or like coffee grounds.  You notice blood in your poop (bowel movements).  You become lightheaded or pass out (faint). MAKE SURE YOU:   Understand these instructions.  Will watch your condition.  Will get help right away if you are not doing well or get worse. Document Released: 12/04/2007 Document Revised: 02/17/2013 Document Reviewed: 11/20/2012 [facility] Patient Information 2014 [place], [place].  Alcohol and Nutrition Nutrition serves two purposes. It provides energy. It also maintains body structure and function. Food supplies energy. It also provides the building blocks needed to replace worn or damaged cells. Alcoholics often eat poorly. This limits their supply of essential nutrients. This affects energy supply and structure  maintenance. Alcohol also affects the body's nutrients in:  Digestion.  Storage.  Using and getting rid of waste products. IMPAIRMENT OF NUTRIENT DIGESTION AND UTILIZATION   Once ingested, food must be broken down into small components (digested). Then it is available for energy. It helps maintain body structure and function. Digestion begins in the mouth. It continues in the stomach and intestines, with help from the pancreas. The nutrients from digested food are absorbed from the intestines into the blood. Then they are carried to the liver. The liver prepares nutrients for:  Immediate use.  Storage and future use.  Alcohol inhibits the breakdown of nutrients into usable molecules.  It decreases secretion of digestive enzymes from the pancreas.  Alcohol impairs nutrient absorption by damaging the cells lining the stomach and intestines.  It also interferes with moving some nutrients into the blood.  In addition, nutritional deficiencies themselves may lead to further absorption problems.  For example, folate deficiency changes the cells that line the small intestine. This impairs how water is absorbed. It also affects absorbed nutrients. These include glucose, sodium, and additional folate.  Even if nutrients are digested and absorbed, alcohol can prevent them from being fully used. It changes their transport, storage, and excretion. Impaired utilization of nutrients by alcoholics is indicated by:  Decreased liver stores of vitamins, such as vitamin A.  Increased excretion of nutrients such as fat. ALCOHOL AND ENERGY SUPPLY   Three basic nutritional components found in food are:  Carbohydrates.  Proteins.  Fats.  These are used as energy. Some alcoholics take in as much as 50% of their total daily  calories from alcohol. They often neglect important foods.  Even when enough food is eaten, alcohol can impair the ways the body controls blood sugar (glucose) levels. It may  either increase or decrease blood sugar.  In non-diabetic alcoholics, increased blood sugar (hyperglycemia) is caused by poor insulin secretion. It is usually temporary.  Decreased blood sugar (hypoglycemia) can cause serious injury even if this condition is short-lived. Low blood sugar can happen when a fasting or malnourished person drinks alcohol. When there is no food to supply energy, stored sugar is used up. The products of alcohol inhibit forming glucose from other compounds such as amino acids. As a result, alcohol causes the brain and other body tissue to lack glucose. It is needed for energy and function.  Alcohol is an energy source. But how the body processes and uses the energy from alcohol is complex. Also, when alcohol is substituted for carbohydrates, subjects tend to lose weight. This indicates that they get less energy from alcohol than from food. ALCOHOL - MAINTAINING CELL STRUCTURE AND FUNCTION  Structure Cells are made mostly of protein. So an adequate protein diet is important for maintaining cell structure. This is especially true if cells are being damaged. Research indicates that alcohol affects protein nutrition by causing impaired:  Digestion of proteins to amino acids.  Processing of amino acids by the small intestine and liver.  Synthesis of proteins from amino acids.  Protein secretion by the liver. Function Nutrients are essential for the body to function well. They provide the tools that the body needs to work well:   Proteins.  Vitamins.  Minerals. Alcohol can disrupt body function. It may cause nutrient deficiencies. And it may interfere with the way nutrients are processed. Vitamins  Vitamins are essential to maintain growth and normal metabolism. They regulate many of the body`s processes. Chronic heavy drinking causes deficiencies in many vitamins. This is caused by eating less. And, in some cases, vitamins may be poorly absorbed. For example, alcohol  inhibits fat absorption. It impairs how the vitamins A, E, and D are normally absorbed along with dietary fats. Not enough vitamin A may cause night blindness. Not enough vitamin D may cause softening of the bones.  Some alcoholics lack vitamins A, C, D, E, K, and the B vitamins. These are all involved in wound healing and cell maintenance. In particular, because vitamin K is necessary for blood clotting, lacking that vitamin can cause delayed clotting. The result is excess bleeding. Lacking other vitamins involved in brain function may cause severe neurological damage. Minerals Deficiencies of minerals such as calcium, magnesium, iron, and zinc are common in alcoholics. The alcohol itself does not seem to affect how these minerals are absorbed. Rather, they seem to occur secondary to other alcohol-related problems, such as:  Less calcium absorbed.  Not enough magnesium.  More urinary excretion.  Vomiting.  Diarrhea.  Not enough iron due to gastrointestinal bleeding.  Not enough zinc or losses related to other nutrient deficiencies.  Mineral deficiencies can cause a variety of medical consequences. These range from calcium-related bone disease to zinc-related night blindness and skin lesions. ALCOHOL, MALNUTRITION, AND MEDICAL COMPLICATIONS  Liver Disease   Alcoholic liver damage is caused primarily by alcohol itself. But poor nutrition may increase the risk of alcohol-related liver damage. For example, nutrients normally found in the liver are known to be affected by drinking alcohol. These include carotenoids, which are the major sources of vitamin A, and vitamin E compounds. Decreases in such nutrients  may play some role in alcohol-related liver damage. Pancreatitis  Research suggests that malnutrition may increase the risk of developing alcoholic pancreatitis. Research suggests that a diet lacking in protein may increase alcohol's damaging effect on the pancreas. Brain  Nutritional  deficiencies may have severe effects on brain function. These may be permanent. Specifically, thiamine deficiencies are often seen in alcoholics. They can cause severe neurological problems. These include:  Impaired movement.  Memory loss seen in Wernicke-Korsakoff syndrome. Pregnancy  Alcohol has toxic effects on fetal development. It causes alcohol-related birth defects. They include fetal alcohol syndrome. Alcohol itself is toxic to the fetus. Also, the nutritional deficiency can affect how the fetus develops. That may compound the risk of developmental damage.  Nutritional needs during pregnancy are 10% to 30% greater than normal. Food intake can increase by as much as 140% to cover the needs of both mother and fetus. An alcoholic mother`s nutritional problems may adversely affect the nutrition of the fetus. And alcohol itself can also restrict nutrition flow to the fetus. NUTRITIONAL STATUS OF ALCOHOLICS  Techniques for assessing nutritional status include:  Taking body measurements to estimate fat reserves. They include:  Weight.  Height.  Mass.  Skin fold thickness.  Performing blood analysis to provide measurements of circulating:  Proteins.  Vitamins.  Minerals.  These techniques tend to be imprecise. For many nutrients, there is no clear "cut-off" point that would allow an accurate definition of deficiency. So assessing the nutritional status of alcoholics is limited by these techniques. Dietary status may provide information about the risk of developing nutritional problems. Dietary status is assessed by:  Taking patients' dietary histories.  Evaluating the amount and types of food they are eating.  It is difficult to determine what exact amount of alcohol begins to have damaging effects on nutrition. In general, moderate drinkers have 2 drinks or less per day. They seem to be at little risk for nutritional problems. Various medical disorders begin to appear at greater  levels.  Research indicates that the majority of even the heaviest drinkers have few obvious nutritional deficiencies. Many alcoholics who are hospitalized for medical complications of their disease do have severe malnutrition. Alcoholics tend to eat poorly. Often they eat less than the amounts of food necessary to provide enough:  Carbohydrates.  Protein.  Fat.  Vitamins A and C.  B vitamins.  Minerals like calcium and iron. Of major concern is alcohol's effect on digesting food and use of nutrients. It may shift a mildly malnourished person toward severe malnutrition. Document Released: 04/11/2005 Document Revised: 09/09/2011 Document Reviewed: 09/25/2005 [facility] Patient Information 2014 [place], [place].

## 2013-07-22 NOTE — ED Provider Notes (Signed)
Medical screening examination/treatment/procedure(s) were performed by non-physician practitioner and as supervising physician I was immediately available for consultation/collaboration.  EKG Interpretation   None         [name], MD 07/22/13 0424 

## 2013-09-20 ENCOUNTER — Encounter (HOSPITAL_COMMUNITY): Payer: Self-pay | Admitting: Emergency Medicine

## 2013-09-20 ENCOUNTER — Emergency Department (HOSPITAL_COMMUNITY)
Admission: EM | Admit: 2013-09-20 | Discharge: 2013-09-20 | Disposition: A | Discharge status: Home / Self Care | Payer: Medicare Other | Attending: Emergency Medicine | Admitting: Emergency Medicine

## 2013-09-20 DIAGNOSIS — Z79899 Other long term (current) drug therapy: Secondary | ICD-10-CM | POA: Insufficient documentation

## 2013-09-20 DIAGNOSIS — G4700 Insomnia, unspecified: Secondary | ICD-10-CM | POA: Insufficient documentation

## 2013-09-20 DIAGNOSIS — R51 Headache: Secondary | ICD-10-CM | POA: Insufficient documentation

## 2013-09-20 DIAGNOSIS — F1010 Alcohol abuse, uncomplicated: Secondary | ICD-10-CM | POA: Insufficient documentation

## 2013-09-20 DIAGNOSIS — R519 Headache, unspecified: Secondary | ICD-10-CM

## 2013-09-20 DIAGNOSIS — F17200 Nicotine dependence, unspecified, uncomplicated: Secondary | ICD-10-CM | POA: Insufficient documentation

## 2013-09-20 MED ORDER — ACETAMINOPHEN 325 MG PO TABS
650.0000 mg | ORAL_TABLET | Freq: Once | ORAL | Status: AC
  Administered 2013-09-20: 650 mg via ORAL
  Filled 2013-09-20: qty 2

## 2013-09-20 MED ORDER — ZOLPIDEM TARTRATE 5 MG PO TABS: 5.0000 mg | ORAL_TABLET | Freq: Every evening | ORAL | Status: DC | PRN

## 2013-09-20 NOTE — ED Provider Notes (Signed)
Medical screening examination/treatment/procedure(s) were performed by non-physician practitioner and as supervising physician I was immediately available for consultation/collaboration.   EKG Interpretation None       Rica Heather R. Laylynn Campanella, MD 09/20/13 1546 

## 2013-09-20 NOTE — ED Notes (Addendum)
Patient states he normally drinks a 40 ounce beer at least 2-3 times a week. Patient last drank yesteday morning. Patient states he drank wine and shared 2  40-ounce beers with a friend. Patient c/o light headache. Patient denies blurred vision. Patient keeps reporting that he "can't sleep." and when questioned he states it has been going on x 1 day.

## 2013-09-20 NOTE — ED Provider Notes (Signed)
CSN: [id number]     Arrival date & time 09/20/13  0850 History   First MD Initiated Contact with Patient 09/20/13 [phone]     Chief Complaint  Patient presents with  . couldn't sleep after drinking ETOH yesterday      (Consider location/radiation/quality/duration/timing/severity/associated sxs/prior Treatment) HPI Blake Gray is a 48 y.o. male who presents to emergency department complaining of a headache. Patient states he drank too much alcohol last night. He states he drinks 2-3 times a week. Yesterday he states he does drink a bottle of wine and beers. He states that he did eat dinner after drinking, and smoked some weed, and went to bed but was unable to sleep. Patient reports persistent headache, denies any neck pain or stiffness, denies any head injuries, denies any nausea or vomiting, denies any abdominal pain. he did not take any medications prior to coming in. He states last time he was here with similar symptoms they gave him a sandwich and something to drink and he was fine. Patient requesting something to help him sleep at home.  History reviewed. No pertinent past medical history. History reviewed. No pertinent past surgical history. No family history on file. History  Substance Use Topics  . Smoking status: Current Some Day Smoker  . Smokeless tobacco: Not on file  . Alcohol Use: Yes    Review of Systems  Constitutional: Negative for fever and chills.  Respiratory: Negative for cough, chest tightness and shortness of breath.   Cardiovascular: Negative for chest pain, palpitations and leg swelling.  Gastrointestinal: Negative for nausea, vomiting, abdominal pain, diarrhea and abdominal distention.  Genitourinary: Negative for dysuria, urgency, frequency and hematuria.  Musculoskeletal: Negative for arthralgias, myalgias, neck pain and neck stiffness.  Skin: Negative for rash.  Allergic/Immunologic: Negative for immunocompromised state.  Neurological: Positive for  headaches. Negative for dizziness, weakness, light-headedness and numbness.      Allergies  Aspirin  Home Medications   Current Outpatient Rx  Name  Route  Sig  Dispense  Refill  . benztropine (COGENTIN) 1 MG tablet   Oral   Take 1 mg by mouth daily.          . haloperidol decanoate (HALDOL DECANOATE) 100 MG/ML injection   Intramuscular   Inject 100 mg into the muscle every 28 (twenty-eight) days.          BP 126/80  Pulse 78  Temp(Src) 98.3 F (36.8 C) (Oral)  Resp 19  SpO2 96% Physical Exam  Nursing note and vitals reviewed. Constitutional: He is oriented to person, place, and time. He appears well-developed and well-nourished. No distress.  HENT:  Head: Normocephalic and atraumatic.  Right Ear: External ear normal.  Left Ear: External ear normal.  Nose: Nose normal.  Mouth/Throat: Oropharynx is clear and moist.  Eyes: Conjunctivae and EOM are normal. Pupils are equal, round, and reactive to light.  Neck: Normal range of motion. Neck supple.  Cardiovascular: Normal rate, regular rhythm and normal heart sounds.   Pulmonary/Chest: Effort normal. No respiratory distress. He has no wheezes. He has no rales.  Abdominal: Soft. Bowel sounds are normal. He exhibits no distension. There is no tenderness. There is no rebound.  Musculoskeletal: He exhibits no edema.  Neurological: He is alert and oriented to person, place, and time. No cranial nerve deficit. Coordination normal.  5/5 and equal upper and lower extremity strength bilaterally. Equal grip strength bilaterally. Normal finger to nose and heel to shin. No pronator drift.   Skin: Skin is  warm and dry.    ED Course  Procedures (including critical care time) Labs Review Labs Reviewed - No data to display Imaging Review No results found.   EKG Interpretation None      MDM   Final diagnoses:  Headache  Insomnia  Alcohol abuse    Patient in emergency department after drinking alcohol last night,  having headache. His exam is normal. He states that last time he did this he just needed some food and started to drink and he felt better. I ordered him a Tylenol, [place] sandwich and water given.    10:27 AM patient is feeling better. His headache resolved after Tylenol. He was able to eat and drink. He is ready to be discharged. Patient asked for a few pills to help him sleep. I will give him 5 ambiens. He's to followup with his primary care Dr.  Ceasar MonsFiled Vitals:   09/20/13 0856  BP: 126/80  Pulse: 78  Temp: 98.3 F (36.8 C)  TempSrc: Oral  Resp: 19  SpO2: 96%      Jacen Carlini A Keziah Drotar, PA-C 09/20/13 1531

## 2013-09-20 NOTE — ED Notes (Signed)
Pt states that he over did it yesterday bc he drank too much and not able to sleep last night and now he is "woozy and not right". Pt states that he doesn't have an alcohol drinking problem.  Pt is continuously rocking back in forth in triage chair.

## 2013-09-20 NOTE — Discharge Instructions (Signed)
Take tylenol for headache. Stop drinking alcohol. Ambien to help you sleep, only take 1 tab at a time. Follow up with your doctor.    Alcohol Problems Most adults who drink alcohol drink in moderation (not a lot) are at low risk for developing problems related to their drinking. However, all drinkers, including low-risk drinkers, should know about the health risks connected with drinking alcohol. RECOMMENDATIONS FOR LOW-RISK DRINKING  Drink in moderation. Moderate drinking is defined as follows:   Men - no more than 2 drinks per day.  Nonpregnant women - no more than 1 drink per day.  Over age 81 - no more than 1 drink per day. A standard drink is 12 grams of pure alcohol, which is equal to a 12 ounce bottle of beer or wine cooler, a 5 ounce glass of wine, or 1.5 ounces of distilled spirits (such as whiskey, brandy, vodka, or rum).  ABSTAIN FROM (DO NOT DRINK) ALCOHOL:  When pregnant or considering pregnancy.  When taking a medication that interacts with alcohol.  If you are alcohol dependent.  A medical condition that prohibits drinking alcohol (such as ulcer, liver disease, or heart disease). DISCUSS WITH YOUR CAREGIVER:  If you are at risk for coronary heart disease, discuss the potential benefits and risks of alcohol use: Light to moderate drinking is associated with lower rates of coronary heart disease in certain populations (for example, men over age 29 and postmenopausal women). Infrequent or nondrinkers are advised not to begin light to moderate drinking to reduce the risk of coronary heart disease so as to avoid creating an alcohol-related problem. Similar protective effects can likely be gained through proper diet and exercise.  Women and the elderly have smaller amounts of body water than men. As a result women and the elderly achieve a higher blood alcohol concentration after drinking the same amount of alcohol.  Exposing a fetus to alcohol can cause a broad range of birth  defects referred to as Fetal Alcohol Syndrome (FAS) or Alcohol-Related Birth Defects (ARBD). Although FAS/ARBD is connected with excessive alcohol consumption during pregnancy, studies also have reported neurobehavioral problems in infants born to mothers reporting drinking an average of 1 drink per day during pregnancy.  Heavier drinking (the consumption of more than 4 drinks per occasion by men and more than 3 drinks per occasion by women) impairs learning (cognitive) and psychomotor functions and increases the risk of alcohol-related problems, including accidents and injuries. CAGE QUESTIONS:   Have you ever felt that you should Cut down on your drinking?  Have people Annoyed you by criticizing your drinking?  Have you ever felt bad or Guilty about your drinking?  Have you ever had a drink first thing in the morning to steady your nerves or get rid of a hangover (Eye opener)? If you answered positively to any of these questions: You may be at risk for alcohol-related problems if alcohol consumption is:   Men: Greater than 14 drinks per week or more than 4 drinks per occasion.  Women: Greater than 7 drinks per week or more than 3 drinks per occasion. Do you or your family have a medical history of alcohol-related problems, such as:  Blackouts.  Sexual dysfunction.  Depression.  Trauma.  Liver dysfunction.  Sleep disorders.  Hypertension.  Chronic abdominal pain.  Has your drinking ever caused you problems, such as problems with your family, problems with your work (or school) performance, or accidents/injuries?  Do you have a compulsion to drink or a  preoccupation with drinking?  Do you have poor control or are you unable to stop drinking once you have started?  Do you have to drink to avoid withdrawal symptoms?  Do you have problems with withdrawal such as tremors, nausea, sweats, or mood disturbances?  Does it take more alcohol than in the past to get you high?  Do  you feel a strong urge to drink?  Do you change your plans so that you can have a drink?  Do you ever drink in the morning to relieve the shakes or a hangover? If you have answered a number of the previous questions positively, it may be time for you to talk to your caregivers, family, and friends and see if they think you have a problem. Alcoholism is a chemical dependency that keeps getting worse and will eventually destroy your health and relationships. Many alcoholics end up dead, impoverished, or in prison. This is often the end result of all chemical dependency.  Do not be discouraged if you are not ready to take action immediately.  Decisions to change behavior often involve up and down desires to change and feeling like you cannot decide.  Try to think more seriously about your drinking behavior.  Think of the reasons to quit. WHERE TO GO FOR ADDITIONAL INFORMATION   The National Institute on Alcohol Abuse and Alcoholism (NIAAA) BasicStudents.dkwww.niaaa.nih.gov  [employer] on Alcoholism and Drug Dependence (NCADD) www.ncadd.org  American Society of Addiction Medicine (ASAM) RoyalDiary.glwww.asam.org  Document Released: 06/17/2005 Document Revised: 09/09/2011 Document Reviewed: 02/03/2008 [facility] Patient Information 2014 [place], [place].

## 2013-12-08 ENCOUNTER — Encounter (HOSPITAL_COMMUNITY): Payer: Self-pay | Admitting: Emergency Medicine

## 2013-12-08 ENCOUNTER — Emergency Department (HOSPITAL_COMMUNITY)
Admission: EM | Admit: 2013-12-08 | Discharge: 2013-12-08 | Disposition: A | Discharge status: Home / Self Care | Payer: Medicare Other | Attending: Emergency Medicine | Admitting: Emergency Medicine

## 2013-12-08 DIAGNOSIS — F17200 Nicotine dependence, unspecified, uncomplicated: Secondary | ICD-10-CM | POA: Insufficient documentation

## 2013-12-08 DIAGNOSIS — Z79899 Other long term (current) drug therapy: Secondary | ICD-10-CM | POA: Insufficient documentation

## 2013-12-08 DIAGNOSIS — M62838 Other muscle spasm: Secondary | ICD-10-CM | POA: Insufficient documentation

## 2013-12-08 MED ORDER — METHOCARBAMOL 500 MG PO TABS: 500.0000 mg | ORAL_TABLET | Freq: Two times a day (BID) | ORAL | Status: DC | PRN

## 2013-12-08 NOTE — Discharge Instructions (Signed)
Take the prescribed medication as directed. °Follow-up with your primary care physician. °Return to the ED for new or worsening symptoms. ° °

## 2013-12-08 NOTE — ED Provider Notes (Signed)
Medical screening examination/treatment/procedure(s) were performed by non-physician practitioner and as supervising physician I was immediately available for consultation/collaboration.   Anas Reister M Aarianna Hoadley, MD 12/08/13 1700 

## 2013-12-08 NOTE — ED Notes (Addendum)
He states his neck and shoulder have been hurting for the past week. He would like a prescription for the pain. He denies any injuries. He took advil with no relief

## 2013-12-08 NOTE — ED Provider Notes (Signed)
CSN: [id number]     Arrival date & time 12/08/13  1036 History  This chart was scribed for non-physician practitioner, Oletha BlendLisa M Melanni Benway, PA-C, working with Lyanne CoKevin M Campos, MD by Shari HeritageAisha Amuda, ED Scribe. This patient was seen in room TR10C/TR10C and the patient's care was started at 12:15 PM.   Chief Complaint  Patient presents with  . Shoulder Pain   The history is provided by the patient. No language interpreter was used.   HPI Comments: Blake MilchJacob C Gray is a 48 y.o. male who presents to the Emergency Department complaining of intermittent left posterior shoulder pain beginning one week ago.  Pain is worse when tilting head upwards.  Patient does not have any obvious injury or trauma but states that he lifts weights.  He has not taken any medicines for pain relief.  Patient reports a history of left shoulder dislocation in the past. He denies neck pain, numbness or weakness of the extremities.  No headaches, fever, or chills.  No chest pain or SOB.  History reviewed. No pertinent past medical history. History reviewed. No pertinent past surgical history. History reviewed. No pertinent family history. History  Substance Use Topics  . Smoking status: Current Some Day Smoker  . Smokeless tobacco: Not on file  . Alcohol Use: Yes    Review of Systems  Constitutional: Negative for fever and chills.  Gastrointestinal: Negative for nausea and vomiting.  Musculoskeletal: Positive for myalgias (left posterior shoulder). Negative for neck pain.  Neurological: Negative for weakness and numbness.  All other systems reviewed and are negative.     Allergies  Aspirin  Home Medications   Prior to Admission medications   Medication Sig Start Date End Date Taking? Authorizing Provider  benztropine (COGENTIN) 1 MG tablet Take 1 mg by mouth daily.  07/08/13   Historical Provider, MD  haloperidol decanoate (HALDOL DECANOATE) 100 MG/ML injection Inject 100 mg into the muscle every 28 (twenty-eight) days.  First of the month usually    Historical Provider, MD  zolpidem (AMBIEN) 5 MG tablet Take 1 tablet (5 mg total) by mouth at bedtime as needed for sleep. 09/20/13   Tatyana A Kirichenko, PA-C  Triage Vitals: BP 118/74  Pulse 86  Temp(Src) 98.2 F (36.8 C) (Oral)  Resp 18  Wt 177 lb 2 oz (80.343 kg)  SpO2 96% Physical Exam  Nursing note and vitals reviewed. Constitutional: He is oriented to person, place, and time. He appears well-developed and well-nourished. No distress.  HENT:  Head: Normocephalic and atraumatic.  Mouth/Throat: Oropharynx is clear and moist.  Eyes: Conjunctivae and EOM are normal. Pupils are equal, round, and reactive to light.  Neck: Normal range of motion and full passive range of motion without pain. Neck supple. No rigidity.  No meningeal signs  Cardiovascular: Normal rate, regular rhythm and normal heart sounds.   Pulmonary/Chest: Effort normal and breath sounds normal. No respiratory distress. He has no wheezes.  Musculoskeletal: Normal range of motion.       Left shoulder: He exhibits tenderness.  Tenderness of left trapezius. Full range of motion of left shoulder. No bony deformities. Normal strength BUE; strong radial pulses and cap refill bilaterally; sensation intact diffusely throughout BUE  Neurological: He is alert and oriented to person, place, and time.  Skin: Skin is warm and dry. He is not diaphoretic.  Psychiatric: He has a normal mood and affect.    ED Course  Procedures (including critical care time) DIAGNOSTIC STUDIES: Oxygen Saturation is 96% on room  air, adequate by my interpretation.    COORDINATION OF CARE:  12:21 PM- Patient informed of current plan for treatment and evaluation and agrees with plan at this time.     MDM   Final diagnoses:  Muscle spasms of neck   48 year old male with posterior neck and left shoulder pain for the past week. No known injuries, trauma, or falls, but patient does admit to lifting weights. He has  tenderness along the left side of his trapezius, I suspect muscular strain. He has a history of left shoulder dislocation, however there are no bony deformities today or evidence of dislocation. His bilateral arms remained neurovascularly intact without numbness/weakness to suggest central cord syndrome.Marland Kitchen He has no chest pain or shortness of breath to suggest atypical ACS. Patient started on Robaxin for comfort. He will follow up with his primary care physician.  Discussed plan with patient, he/she acknowledged understanding and agreed with plan of care.  Return precautions given for new or worsening symptoms  I personally performed the services described in this documentation, which was scribed in my presence. The recorded information has been reviewed and is accurate.  Garlon Hatchet, PA-C 12/08/13 1328

## 2014-01-22 ENCOUNTER — Emergency Department (HOSPITAL_COMMUNITY)
Admission: EM | Admit: 2014-01-22 | Discharge: 2014-01-22 | Disposition: A | Discharge status: Home / Self Care | Payer: Medicare Other | Attending: Emergency Medicine | Admitting: Emergency Medicine

## 2014-01-22 ENCOUNTER — Encounter (HOSPITAL_COMMUNITY): Payer: Self-pay | Admitting: Emergency Medicine

## 2014-01-22 DIAGNOSIS — Z711 Person with feared health complaint in whom no diagnosis is made: Secondary | ICD-10-CM | POA: Diagnosis not present

## 2014-01-22 DIAGNOSIS — Z79899 Other long term (current) drug therapy: Secondary | ICD-10-CM | POA: Diagnosis not present

## 2014-01-22 DIAGNOSIS — F17200 Nicotine dependence, unspecified, uncomplicated: Secondary | ICD-10-CM | POA: Diagnosis not present

## 2014-01-22 DIAGNOSIS — Z008 Encounter for other general examination: Secondary | ICD-10-CM | POA: Insufficient documentation

## 2014-01-22 LAB — CBG MONITORING, ED: GLUCOSE-CAPILLARY: 115 mg/dL — AB (ref 70–99)

## 2014-01-22 NOTE — ED Notes (Signed)
Pt A+O, presents with "i want to get tested for HIV and diabetes", sts "i've been losing a lot of weight, my clothes are falling off".  Pt then sts "only 2 or 3 pounds".  Pt denies other complaints.  Skin PWD.  MAEI.  NAD.

## 2014-01-22 NOTE — ED Notes (Signed)
Per [facility]-patient wants to be tested for HIV-states he has had some weight loss-has been drinking today

## 2014-01-22 NOTE — ED Provider Notes (Signed)
CSN: [id number]     Arrival date & time 01/22/14  1335 History  This chart was scribed for non-physician practitioner, Wynetta EmeryNicole Janiel Crisostomo, PA-C,working with Linwood DibblesJon Knapp, MD, by Karle PlumberJennifer Tensley, ED Scribe.  This patient was seen in room WTR8/WTR8 and the patient's care was started at 2:06 PM.  Chief Complaint  Patient presents with  . Lab test    The history is provided by the patient. No language interpreter was used.   HPI Comments:  Blake Gray is a 48 y.o. male who presents to the Emergency Department complaining of losing a lot of weight (patient notes that after waking himself at triage she is actually gained 1 pound since he was last evaluated several weeks ago) and wants to be tested for HIV and diabetes. He states he has had unprotected sex with several different women.  He reports having a negative HIV test two weeks ago. He denies rashes, lesions, penile discharge, dysuria, fever, chills, nausea, vomiting, bowel or bladder changes, bleeding or bruising. He does not have a PCP, but states he has Medicare and Medicaid.  History reviewed. No pertinent past medical history. History reviewed. No pertinent past surgical history. No family history on file. History  Substance Use Topics  . Smoking status: Current Some Day Smoker  . Smokeless tobacco: Not on file  . Alcohol Use: Yes    Review of Systems  Constitutional: Positive for unexpected weight change. Negative for fever and chills.  Gastrointestinal: Negative for nausea and vomiting.  Genitourinary: Negative for dysuria and discharge.  All other systems reviewed and are negative.   Allergies  Aspirin  Home Medications   Prior to Admission medications   Medication Sig Start Date End Date Taking? Authorizing Provider  benztropine (COGENTIN) 1 MG tablet Take 1 mg by mouth daily.  07/08/13   Historical Provider, MD  haloperidol decanoate (HALDOL DECANOATE) 100 MG/ML injection Inject 100 mg into the muscle every 28 (twenty-eight)  days. First of the month usually    Historical Provider, MD  methocarbamol (ROBAXIN) 500 MG tablet Take 1 tablet (500 mg total) by mouth 2 (two) times daily as needed. 12/08/13   Garlon HatchetLisa M Sanders, PA-C  zolpidem (AMBIEN) 5 MG tablet Take 1 tablet (5 mg total) by mouth at bedtime as needed for sleep. 09/20/13   Tatyana A Kirichenko, PA-C   Triage Vitals: BP 114/77  Pulse 97  Temp(Src) 98.3 F (36.8 C) (Oral)  Resp 16  SpO2 97% Physical Exam  Nursing note and vitals reviewed. Constitutional: He is oriented to person, place, and time. He appears well-developed and well-nourished.  HENT:  Head: Normocephalic and atraumatic.  Mouth/Throat: Oropharynx is clear and moist.  Eyes: Conjunctivae and EOM are normal. Pupils are equal, round, and reactive to light.  Neck: Normal range of motion. Neck supple. No JVD present. No thyromegaly present.  Cardiovascular: Normal rate, regular rhythm, normal heart sounds and intact distal pulses.   Pulmonary/Chest: Effort normal and breath sounds normal. No stridor. No respiratory distress. He has no wheezes. He has no rales. He exhibits no tenderness.  Abdominal: Soft. Bowel sounds are normal. He exhibits no distension and no mass. There is no tenderness. There is no rebound and no guarding.  Musculoskeletal: Normal range of motion. He exhibits no edema and no tenderness.  Lymphadenopathy:    He has no cervical adenopathy.  Neurological: He is alert and oriented to person, place, and time.  Skin: Skin is warm and dry. No rash noted. No erythema. No pallor.  Psychiatric: He has a normal mood and affect. His behavior is normal.    ED Course  Procedures (including critical care time) DIAGNOSTIC STUDIES: Oxygen Saturation is 97% on RA, normal by my interpretation.   COORDINATION OF CARE: 2:09 PM- Will check CBG and refer to PCP. Pt verbalizes understanding and agrees to plan.  Medications - No data to display  Labs Review Labs Reviewed  CBG MONITORING, ED  - Abnormal; Notable for the following:    Glucose-Capillary 115 (*)    All other components within normal limits    Imaging Review No results found.   EKG Interpretation None      MDM   Final diagnoses:  Worried well   Filed Vitals:   01/22/14 1352 01/22/14 1359  BP: 114/77   Pulse: 97   Temp: 98.3 F (36.8 C)   TempSrc: Oral   Resp: 16   Weight:  178 lb (80.74 kg)  SpO2: 97%     Medications - No data to display  Blake Gray is a 48 y.o. male presenting with request for HIV and diabetes testing. Patient had negative HIV test 2 weeks ago. States he has continued to have unprotected sex. Patient states it is closer fitting him in her falling off however we see that he is actually gained 1 pound since his last evaluation. Patient with normal vital signs, no signs of malignancy or diabetes on review of systems. I've advised the patient that he will need to follow with primary care. We have had a discussion of return precautions for emergency evaluation.  Evaluation does not show pathology that would require ongoing emergent intervention or inpatient treatment. Pt is hemodynamically stable and mentating appropriately. Discussed findings and plan with patient/guardian, who agrees with care plan. All questions answered. Return precautions discussed and outpatient follow up given.    I personally performed the services described in this documentation, which was scribed in my presence. The recorded information has been reviewed and is accurate.    Wynetta Emery, PA-C 01/22/14 1529

## 2014-01-22 NOTE — Discharge Instructions (Signed)
Do not hesitate to return to the Emergency Department for any new, worsening or concerning symptoms.  ° °If you do not have a primary care doctor you can establish one at the  ° °[facility]: °[address] °[place] [address] °[phone] ° °After you establish care. Let them know you were seen in the emergency room. They must obtain records for further management.  ° ° °Emergency Department Resource Guide °1) Find a Doctor and Pay Out of Pocket °Although you won't have to find out who is covered by your insurance plan, it is a good idea to ask around and get recommendations. You will then need to call the office and see if the doctor you have chosen will accept you as a new patient and what types of options they offer for patients who are self-pay. Some doctors offer discounts or will set up payment plans for their patients who do not have insurance, but you will need to ask so you aren't surprised when you get to your appointment. ° °2) Contact Your Local Health Department °Not all health departments have doctors that can see patients for sick visits, but many do, so it is worth a call to see if yours does. If you don't know where your local health department is, you can check in your phone book. The CDC also has a tool to help you locate your state's health department, and many state websites also have listings of all of their local health departments. ° °3) Find a Walk-in Clinic °If your illness is not likely to be very severe or complicated, you may want to try a walk in clinic. These are popping up all over the country in pharmacies, drugstores, and shopping centers. They're usually staffed by nurse practitioners or physician assistants that have been trained to treat common illnesses and complaints. They're usually fairly quick and inexpensive. However, if you have serious medical issues or chronic medical problems, these are probably not your best option. ° °No Primary Care  Doctor: °- Call Health Connect at  832-8000 - they can help you locate a primary care doctor that  accepts your insurance, provides certain services, etc. °- Physician Referral Service- 1-800-533-3463 ° °Chronic Pain Problems: °Organization         Address  Phone   Notes  °[facility]  [phone] Patients need to be referred by their primary care doctor.  ° °Medication Assistance: °Organization         Address  Phone   Notes  °[place] [facility] [address] °[place], [place] [address] [phone]) [phone] --Must be a resident of [place] °-- Must have NO insurance coverage whatsoever (no Medicaid/ Medicare, etc.) °-- The pt. MUST have a primary care doctor that directs their care regularly and follows them in the community °  °MedAssist  (866) 331-1348   °[employer]  (888) 892-1162   ° °Agencies that provide inexpensive medical care: °Organization         Address  Phone   Notes  °[facility]  ([phone]) [phone]   °[facility]    ([phone]) [phone]   °[facility] [address] °[place], [place] [address] [phone]) [phone]   °[facility] [address], ° (336) [phone]   °Planned Parenthood    (336) 373-0678   °[facility]    (336) 272-1050   °[facility] °   Center  [address], [place] Phone:  [phone](336) [phone], Fax:  [phone] Hours of Operation:  9 am - 6 pm, M-F.  Also accepts Medicaid/Medicare and self-pay.  [facility]  [address], [place] Phone: [phone](336) [phone], Fax: [phone](336) [phone]. Hours of Operation:  8:30 am - 5:30 pm, M-F.  Also accepts Medicaid and self-pay.  [facility] [place] [address], [place] Phone: [phone](336) [phone]   [facility] [address], [place], [place] [phone], Ext. [phone] Mondays & Thursdays: 7-9 AM.  First 15 patients are seen on a first  come, first serve basis.    Medicaid-accepting [facility] Providers:  Organization         Address  Phone   Notes  [facility] [address], [place] [phone](336) [phone] Also accepts self-pay patients.  [facility] [address], [place]  [phone]) [phone]   [facility] [address], [place] [phone]) [phone]   [facility] [address] [place] [address], [place] [phone]) [phone]   Renaye RakersVeita Bland [address], [place]   [phone](336) [phone] Only accepts [place] Access [place] patients after they have their name applied to their card.   Self-Pay (no insurance) in [facility] [place]:  Organization         Address  Phone   Notes  Sickle Cell Patients, [facility] [address], [place] [phone]) [phone]   [facility] [address], [place] [phone]) [phone]   [facility] [place]  [address], [place] [phone]) [phone]   [facility]/Dr. Osei-Bonsu  [address] [place] [address], [place] or [phone], [place] [phone]) [phone] Phone number for both [place] and [place] locations is the same.  [facility] [address], [place] [phone]) [phone]   [facility] [place] [address] [place] [address], [place] or [address] [phone]) [phone]   [facility] [address], [place] [phone]) [phone], phone; [phone]) [phone], fax Sees patients 1st and 3rd Saturday of every month.  Must not qualify for public or private insurance (i.e. Medicaid, Medicare, [place] Health Choice, Veterans' Benefits)  Household income should be no more than 200% of the poverty level The clinic cannot treat you if you are pregnant or think you are pregnant  Sexually transmitted diseases are not treated at the clinic.    Dental Care: Organization          Address  Phone  Notes  [facility] [address], [place] [phone]) [phone] Accepts children up to age 50 who are enrolled in [place] or [place] Health Choice; pregnant women with a Medicaid card; and children who have applied for Medicaid or [place] Health Choice, but were declined, whose parents can pay a reduced fee at time of service.  [facility] [place]  [address], [place] [phone]) [phone] Accepts children up to age 50 who are enrolled in [place] or [place] Health Choice; pregnant women with a Medicaid card; and children who have applied for Medicaid or  Health Choice, but were declined, whose parents can pay a reduced fee at time of service.  [facility]  [address], [place] (  [phone]) [phone] Patients are seen by appointment only. Walk-ins are not accepted. [place] will see patients 64 years of age and older. Monday - Tuesday (8am-5pm) Most Wednesdays (8:30-5pm) $30 per visit, cash only  [facility]  [address], [facility] [phone] Patients are seen by appointment only. Walk-ins are not accepted. [place] will see patients 78 years of age and older. One Wednesday Evening (Monthly: Volunteer Based).  $30 per visit, cash only  [place]  [phone] for adults; Children under age 61, call Graduate Pediatric Dentistry at [phone]. Children aged 2-14, please call [phone] to request a pediatric application.  Dental services are provided in all areas of dental care including fillings, crowns and bridges, complete and partial dentures, implants, gum treatment, root canals, and extractions. Preventive care is also provided. Treatment is provided to both adults and children. Patients are selected via a lottery and there is often a waiting list.   [facility] [address] [place]  [phone] [email or web address]   [facility] [address] [place], [place] [phone], Ext. [phone] Second and Fourth Thursday of each month, opens at 6:30 AM; Clinic ends at 9 AM.  Patients are seen on a first-come first-served basis, and a limited number are seen during each clinic.   [facility] [address] Hillard Danker [place], [place] [phone]   Eligibility Requirements You must have lived in [place], [place], or [place] counties for at least the last three months.   You cannot be eligible for state or federal sponsored [employer], including [employer], [place], or [employer].   You generally cannot be eligible for healthcare insurance through your employer.    How to apply: Eligibility screenings are held every Tuesday and Wednesday afternoon from 1:00 pm until 4:00 pm. You do not need an appointment for the interview!  [facility] [address], [place], [place]   [place]  [place] Department  [place]  [phone]    Behavioral Health Resources in the Community: Intensive Outpatient Programs Organization         Address  Phone  Notes  [place] [address], [place], [place] [phone]   [facility] [address] [place], [place]   ADS: [facility] Drug [facility] [address], [place], [place]   [place] [address]  [place], [place] or [phone]   Substance Abuse Resources Organization         Address  Phone  Notes  Alcohol and Drug Services  8702276460   [place]  [phone]   [facility] [place]   Chinita Pester  2015241121   Residential & Outpatient Substance Abuse Program  [phone]   Psychological  Services Organization         Address  Phone  Notes  [facility] [place]  [place]  [phone]   [place] [address] [place] or [phone]    Mobile Crisis Teams Organization         Address  Phone  Notes  [facility], Mobile Crisis Care Unit  [phone]   [facility]  [address] [place], [place]   [place] Levels [address] [place] [phone]    Self-Help/Support Groups Organization         Address  Phone             Notes  [facility] of [place] - variety of support groups  Kieler Call for more information  Narcotics Anonymous (NA), [facility] [address], [employer] [place]  2 meetings at this location   Special educational needs teacher         Address  Phone  Notes  [facility] [place],    [place]  [phone]   [facility]  [address], [place] [address]86, [place], [place]   [place], [place] [phone] Admissions: 8am-3pm M-F  Incentives Substance [place] [address],    [place], [place] [phone]   [facility] [address] [place], [place], [place]   [facility] [address],  [place], [place]   [facility] - Intensive Outpatient [address], [place], [place]   [facility].,  [place], [place] [phone] or [phone]   [facility] (RTS) [address], [place], [place] Accepts Medicaid  [facility] [address],  [place] [phone] Substance Abuse/Addiction Treatment   [facility] Organization         Address  Phone  Notes  [facility]  [phone]   Domenic Schwab, PhD [address]lis Port[place], [place]   [phone] or [phone]   [place], [place] [phone]   [facility] [address] [place], [place] [phone] Insurance/Medicaid/sponsorship through [facility] [address] [place]                                    [place], [place] [phone] [place] [address], [place] [phone]    Dr. Adele Schilder  [phone]   [facility] Dept. 1) [address] [place] 2) [place] 3)  [place] [address], [place] [phone]  [phone]   [place] [phone] or [phone] (After Hours)

## 2014-01-22 NOTE — ED Notes (Signed)
Pt reports "just got tested a couple weeks ago but i want to get tested again".

## 2014-01-23 NOTE — ED Provider Notes (Signed)
Medical screening examination/treatment/procedure(s) were performed by non-physician practitioner and as supervising physician I was immediately available for consultation/collaboration.   Emmary Culbreath, MD 01/23/14 0756 

## 2014-07-13 DIAGNOSIS — F209 Schizophrenia, unspecified: Secondary | ICD-10-CM | POA: Diagnosis not present

## 2014-07-19 ENCOUNTER — Encounter (HOSPITAL_COMMUNITY): Payer: Self-pay | Admitting: Emergency Medicine

## 2014-07-19 ENCOUNTER — Emergency Department (HOSPITAL_COMMUNITY)
Admission: EM | Admit: 2014-07-19 | Discharge: 2014-07-19 | Disposition: A | Discharge status: Home / Self Care | Payer: Medicare Other | Attending: Emergency Medicine | Admitting: Emergency Medicine

## 2014-07-19 ENCOUNTER — Emergency Department (HOSPITAL_COMMUNITY): Payer: Medicare Other

## 2014-07-19 DIAGNOSIS — S8001XA Contusion of right knee, initial encounter: Secondary | ICD-10-CM | POA: Insufficient documentation

## 2014-07-19 DIAGNOSIS — M25561 Pain in right knee: Secondary | ICD-10-CM | POA: Diagnosis not present

## 2014-07-19 DIAGNOSIS — Z8659 Personal history of other mental and behavioral disorders: Secondary | ICD-10-CM | POA: Insufficient documentation

## 2014-07-19 DIAGNOSIS — W01198A Fall on same level from slipping, tripping and stumbling with subsequent striking against other object, initial encounter: Secondary | ICD-10-CM | POA: Diagnosis not present

## 2014-07-19 DIAGNOSIS — Z720 Tobacco use: Secondary | ICD-10-CM | POA: Insufficient documentation

## 2014-07-19 DIAGNOSIS — Y9389 Activity, other specified: Secondary | ICD-10-CM | POA: Diagnosis not present

## 2014-07-19 DIAGNOSIS — M25569 Pain in unspecified knee: Secondary | ICD-10-CM | POA: Diagnosis not present

## 2014-07-19 DIAGNOSIS — Z79899 Other long term (current) drug therapy: Secondary | ICD-10-CM | POA: Diagnosis not present

## 2014-07-19 DIAGNOSIS — Y9289 Other specified places as the place of occurrence of the external cause: Secondary | ICD-10-CM | POA: Diagnosis not present

## 2014-07-19 DIAGNOSIS — Y998 Other external cause status: Secondary | ICD-10-CM | POA: Insufficient documentation

## 2014-07-19 DIAGNOSIS — S8991XA Unspecified injury of right lower leg, initial encounter: Secondary | ICD-10-CM | POA: Diagnosis not present

## 2014-07-19 DIAGNOSIS — W19XXXA Unspecified fall, initial encounter: Secondary | ICD-10-CM

## 2014-07-19 DIAGNOSIS — R6889 Other general symptoms and signs: Secondary | ICD-10-CM | POA: Diagnosis not present

## 2014-07-19 HISTORY — DX: Bipolar disorder, unspecified: F31.9

## 2014-07-19 MED ORDER — IBUPROFEN 200 MG PO TABS
600.0000 mg | ORAL_TABLET | Freq: Once | ORAL | Status: AC
  Administered 2014-07-19: 600 mg via ORAL
  Filled 2014-07-19: qty 3

## 2014-07-19 MED ORDER — IBUPROFEN 600 MG PO TABS: 600.0000 mg | ORAL_TABLET | Freq: Four times a day (QID) | ORAL | Status: AC | PRN

## 2014-07-19 NOTE — ED Provider Notes (Signed)
CSN: [id number]     Arrival date & time 07/19/14  8119 History   First MD Initiated Contact with Patient 07/19/14 1005     Chief Complaint  Patient presents with  . Knee Pain    r/knee pain after slip and fall last night  . Fall     (Consider location/radiation/quality/duration/timing/severity/associated sxs/prior Treatment) HPI Comments: Patient presents with complaint of right knee pain after slipping on a wet floor yesterday. Patient landed on his right knee cap. He complains of pain with movement of the leg and walking, however is ambulatory. He denies other injury. No distal numbness or tingling. No treatments prior to arrival.  The history is provided by the patient.    Past Medical History  Diagnosis Date  . Bipolar 1 disorder    Past Surgical History  Procedure Laterality Date  . Mouth surgery    . Tonsillectomy     History reviewed. No pertinent family history. History  Substance Use Topics  . Smoking status: Current Some Day Smoker  . Smokeless tobacco: Not on file  . Alcohol Use: Yes    Review of Systems  Constitutional: Negative for activity change.  Musculoskeletal: Positive for arthralgias and gait problem. Negative for back pain, joint swelling and neck pain.  Skin: Negative for wound.  Neurological: Negative for weakness and numbness.      Allergies  Aspirin  Home Medications   Prior to Admission medications   Medication Sig Start Date End Date Taking? Authorizing Provider  benztropine (COGENTIN) 1 MG tablet Take 1 mg by mouth daily.  07/08/13   Historical Provider, MD  haloperidol decanoate (HALDOL DECANOATE) 100 MG/ML injection Inject 100 mg into the muscle every 28 (twenty-eight) days. First of the month usually    Historical Provider, MD  methocarbamol (ROBAXIN) 500 MG tablet Take 1 tablet (500 mg total) by mouth 2 (two) times daily as needed. 12/08/13   Garlon Hatchet, PA-C  zolpidem (AMBIEN) 5 MG tablet Take 1 tablet (5 mg total) by mouth at  bedtime as needed for sleep. 09/20/13   Tatyana A Kirichenko, PA-C   BP 120/73 mmHg  Pulse 75  Temp(Src) 98.5 F (36.9 C) (Oral)  Resp 18  SpO2 100% Physical Exam  Constitutional: He appears well-developed and well-nourished.  HENT:  Head: Normocephalic and atraumatic.  Eyes: Conjunctivae are normal.  Neck: Normal range of motion. Neck supple.  Cardiovascular: Normal pulses.   Musculoskeletal: He exhibits tenderness. He exhibits no edema.       Right hip: Normal.       Right knee: He exhibits normal range of motion, no swelling and no effusion. Tenderness (patella) found. Medial joint line and lateral joint line tenderness noted.       Right ankle: Normal. No proximal fibula tenderness found.       Legs: Neurological: He is alert. No sensory deficit.  Motor, sensation, and vascular distal to the injury is fully intact. Normal gait.  Skin: Skin is warm and dry.  Psychiatric: He has a normal mood and affect.  Nursing note and vitals reviewed.   ED Course  Procedures (including critical care time) Labs Review Labs Reviewed - No data to display  Imaging Review Dg Knee Complete 4 Views Right  07/19/2014   CLINICAL DATA:  Slipped on floor last night. Fall. Knee pain. Initial encounter  EXAM: RIGHT KNEE - COMPLETE 4+ VIEW  COMPARISON:  08/25/2006  FINDINGS: Normal alignment. No fracture. Mild patellar spurring. Mild irregularity of the lateral femoral  condyle. No significant joint space narrowing medially or laterally. Negative for mass lesion. No significant effusion.  IMPRESSION: No acute abnormality.   Electronically Signed   By: Marlan Palauharles  Clark M.D.   On: 07/19/2014 10:49     EKG Interpretation None      10:17 AM Patient seen and examined. Medications ordered.   Vital signs reviewed and are as follows:  BP 120/73 mmHg  Pulse 75  Temp(Src) 98.5 F (36.9 C) (Oral)  Resp 18  SpO2 100%   11:00 AM x-ray reviewed by myself. Patient informed of results. Will give knee sleeve,  ice pack, NSAIDs for home. Patient encouraged to follow-up with his primary care physician in 1 week if not improved.   MDM   Final diagnoses:  Fall   Patient with knee pain after fall. X-ray performed due to patellar tenderness. X-rays negative. Suspect knee contusion. No other adjacent injuries. No signs of patellar ligament rupture or quadriceps tendon rupture. Lower extremity is neurovascularly intact without any signs of compartment syndrome. Patient is ambulatory. Conservative management indicated with follow-up as needed if not improving.  No dangerous or life-threatening conditions suspected or identified by history, physical exam, and by work-up. No indications for hospitalization identified.       Renne CriglerJoshua Simmie Garin, PA-C 07/19/14 1102  Gilda Creasehristopher J. Pollina, MD 07/19/14 1106

## 2014-07-19 NOTE — ED Notes (Signed)
Per EMS pt slipped and fell on tile floor, hit his right knee, denies head/back injury, denies LOC.

## 2014-07-19 NOTE — Discharge Instructions (Signed)
Please read and follow all provided instructions.  Your diagnoses today include:  1. Contusion of right knee, initial encounter   2. Fall     Tests performed today include:  An x-ray of the affected area - does NOT show any broken bones  Vital signs. See below for your results today.   Medications prescribed:   Ibuprofen (Motrin, Advil) - anti-inflammatory pain medication  Do not exceed 600mg  ibuprofen every 6 hours, take with food  You have been prescribed an anti-inflammatory medication or NSAID. Take with food. Take smallest effective dose for the shortest duration needed for your pain. Stop taking if you experience stomach pain or vomiting.   Take any prescribed medications only as directed.  Home care instructions:   Follow any educational materials contained in this packet  Follow R.I.C.E. Protocol:  R - rest your injury   I  - use ice on injury without applying directly to skin  C - compress injury with bandage or splint  E - elevate the injury as much as possible  Follow-up instructions: Please follow-up with your primary care provider if you continue to have significant pain in 1 week. In this case you may have a more severe injury that requires further care.   Return instructions:   Please return if your toes or feet are numb or tingling, appear gray or blue, or you have severe pain (also elevate the leg and loosen splint or wrap if you were given one)  Please return to the Emergency Department if you experience worsening symptoms.   Please return if you have any other emergent concerns.  Additional Information:  Your vital signs today were: BP 120/73 mmHg   Pulse 75   Temp(Src) 98.5 F (36.9 C) (Oral)   Resp 18   SpO2 100% If your blood pressure (BP) was elevated above 135/85 this visit, please have this repeated by your doctor within one month. --------------

## 2014-07-25 ENCOUNTER — Emergency Department (HOSPITAL_COMMUNITY): Payer: Medicare Other

## 2014-07-25 ENCOUNTER — Encounter (HOSPITAL_COMMUNITY): Payer: Self-pay | Admitting: Emergency Medicine

## 2014-07-25 ENCOUNTER — Emergency Department (HOSPITAL_COMMUNITY)
Admission: EM | Admit: 2014-07-25 | Discharge: 2014-07-26 | Disposition: A | Discharge status: Home / Self Care | Payer: Medicare Other | Attending: Emergency Medicine | Admitting: Emergency Medicine

## 2014-07-25 DIAGNOSIS — Y9389 Activity, other specified: Secondary | ICD-10-CM | POA: Insufficient documentation

## 2014-07-25 DIAGNOSIS — Y998 Other external cause status: Secondary | ICD-10-CM | POA: Diagnosis not present

## 2014-07-25 DIAGNOSIS — W000XXA Fall on same level due to ice and snow, initial encounter: Secondary | ICD-10-CM | POA: Diagnosis not present

## 2014-07-25 DIAGNOSIS — F209 Schizophrenia, unspecified: Secondary | ICD-10-CM | POA: Insufficient documentation

## 2014-07-25 DIAGNOSIS — M25569 Pain in unspecified knee: Secondary | ICD-10-CM | POA: Diagnosis not present

## 2014-07-25 DIAGNOSIS — W010XXA Fall on same level from slipping, tripping and stumbling without subsequent striking against object, initial encounter: Secondary | ICD-10-CM | POA: Diagnosis not present

## 2014-07-25 DIAGNOSIS — S8991XA Unspecified injury of right lower leg, initial encounter: Secondary | ICD-10-CM | POA: Diagnosis present

## 2014-07-25 DIAGNOSIS — Z720 Tobacco use: Secondary | ICD-10-CM | POA: Insufficient documentation

## 2014-07-25 DIAGNOSIS — Z79899 Other long term (current) drug therapy: Secondary | ICD-10-CM | POA: Insufficient documentation

## 2014-07-25 DIAGNOSIS — S83206A Unspecified tear of unspecified meniscus, current injury, right knee, initial encounter: Secondary | ICD-10-CM | POA: Diagnosis not present

## 2014-07-25 DIAGNOSIS — S83104A Unspecified dislocation of right knee, initial encounter: Secondary | ICD-10-CM | POA: Diagnosis not present

## 2014-07-25 DIAGNOSIS — S83106A Unspecified dislocation of unspecified knee, initial encounter: Secondary | ICD-10-CM

## 2014-07-25 DIAGNOSIS — Y9289 Other specified places as the place of occurrence of the external cause: Secondary | ICD-10-CM | POA: Insufficient documentation

## 2014-07-25 DIAGNOSIS — T148 Other injury of unspecified body region: Secondary | ICD-10-CM | POA: Diagnosis not present

## 2014-07-25 LAB — CBC WITH DIFFERENTIAL/PLATELET
BASOS ABS: 0 10*3/uL (ref 0.0–0.1)
BASOS PCT: 0 % (ref 0–1)
EOS ABS: 0.1 10*3/uL (ref 0.0–0.7)
Eosinophils Relative: 1 % (ref 0–5)
HCT: 42.4 % (ref 39.0–52.0)
Hemoglobin: 14.8 g/dL (ref 13.0–17.0)
LYMPHS ABS: 0.9 10*3/uL (ref 0.7–4.0)
Lymphocytes Relative: 9 % — ABNORMAL LOW (ref 12–46)
MCH: 31.4 pg (ref 26.0–34.0)
MCHC: 34.9 g/dL (ref 30.0–36.0)
MCV: 90 fL (ref 78.0–100.0)
MONO ABS: 0.4 10*3/uL (ref 0.1–1.0)
Monocytes Relative: 4 % (ref 3–12)
Neutro Abs: 8.8 10*3/uL — ABNORMAL HIGH (ref 1.7–7.7)
Neutrophils Relative %: 86 % — ABNORMAL HIGH (ref 43–77)
Platelets: 240 10*3/uL (ref 150–400)
RBC: 4.71 MIL/uL (ref 4.22–5.81)
RDW: 13.2 % (ref 11.5–15.5)
WBC: 10.1 10*3/uL (ref 4.0–10.5)

## 2014-07-25 LAB — BASIC METABOLIC PANEL
ANION GAP: 7 (ref 5–15)
BUN: 11 mg/dL (ref 6–23)
CALCIUM: 9.2 mg/dL (ref 8.4–10.5)
CO2: 25 mmol/L (ref 19–32)
Chloride: 105 mmol/L (ref 96–112)
Creatinine, Ser: 0.9 mg/dL (ref 0.50–1.35)
GFR calc Af Amer: >90 mL/min (ref >90)
Glucose, Bld: 104 mg/dL — ABNORMAL HIGH (ref 70–99)
POTASSIUM: 3.9 mmol/L (ref 3.5–5.1)
Sodium: 137 mmol/L (ref 135–145)

## 2014-07-25 MED ORDER — MIDAZOLAM HCL 2 MG/2ML IJ SOLN
INTRAMUSCULAR | Status: AC | PRN
  Administered 2014-07-25: 4 mg via INTRAVENOUS

## 2014-07-25 MED ORDER — FENTANYL CITRATE 0.05 MG/ML IJ SOLN
INTRAMUSCULAR | Status: AC | PRN
  Administered 2014-07-25 (×2): 50 ug via INTRAVENOUS

## 2014-07-25 MED ORDER — IOHEXOL 350 MG/ML SOLN
100.0000 mL | Freq: Once | INTRAVENOUS | Status: AC | PRN
  Administered 2014-07-25: 100 mL via INTRAVENOUS

## 2014-07-25 MED ORDER — ETOMIDATE 2 MG/ML IV SOLN
10.0000 mg | Freq: Once | INTRAVENOUS | Status: AC
  Administered 2014-07-25: 10 mg via INTRAVENOUS
  Filled 2014-07-25: qty 10

## 2014-07-25 MED ORDER — ETOMIDATE 2 MG/ML IV SOLN
INTRAVENOUS | Status: AC | PRN
  Administered 2014-07-25: 10 mg via INTRAVENOUS
  Administered 2014-07-25 (×3): 2 mg via INTRAVENOUS

## 2014-07-25 MED ORDER — GADOBENATE DIMEGLUMINE 529 MG/ML IV SOLN
20.0000 mL | Freq: Once | INTRAVENOUS | Status: AC | PRN
  Administered 2014-07-25: 20 mL via INTRAVENOUS

## 2014-07-25 MED ORDER — MIDAZOLAM HCL 2 MG/2ML IJ SOLN
INTRAMUSCULAR | Status: AC
  Filled 2014-07-25: qty 4

## 2014-07-25 MED ORDER — FLUMAZENIL 0.5 MG/5ML IV SOLN
INTRAVENOUS | Status: AC
  Filled 2014-07-25: qty 5

## 2014-07-25 MED ORDER — FLUMAZENIL 0.5 MG/5ML IV SOLN
INTRAVENOUS | Status: AC | PRN
  Administered 2014-07-25: .4 mg via INTRAVENOUS

## 2014-07-25 MED ORDER — FENTANYL CITRATE 0.05 MG/ML IJ SOLN
INTRAMUSCULAR | Status: AC
  Filled 2014-07-25: qty 2

## 2014-07-25 NOTE — ED Provider Notes (Addendum)
CSN: [id number]     Arrival date & time 07/25/14  1416 History   First MD Initiated Contact with Patient 07/25/14 1441      (Consider location/radiation/quality/duration/timing/severity/associated sxs/prior Treatment) HPI  Review of Systems    Allergies  Aspirin  Home Medications   Physical Exam  ED Course  Procedural sedation Date/Time: 07/25/2014 5:00 PM Performed by: Toy BakerALLEN, Jesten Cappuccio T Authorized by: Lorre NickALLEN, Savana Spina T Consent: Verbal consent obtained. Written consent obtained. Risks and benefits: risks, benefits and alternatives were discussed Consent given by: patient Patient understanding: patient states understanding of the procedure being performed Patient identity confirmed: verbally with patient and arm band Time out: Immediately prior to procedure a "time out" was called to verify the correct patient, procedure, equipment, support staff and site/side marked as required. Patient sedated: yes Sedation type: moderate (conscious) sedation Sedatives: etomidate and midazolam Analgesia: fentanyl Sedation start date/time: 07/25/2014 4:30 PM Sedation end date/time: 07/25/2014 4:50 PM Vitals: Vital signs were monitored during sedation. Patient tolerance: Patient tolerated the procedure well with no immediate complications   I Patient had his versed reversed with Romazicon. He tolerated this well without complications  Attempted to reduce knee with traction countertraction without success. Dr. Eulah PontMurphy for the pieces here and he has reduced the patient's knee.  Toy BakerAnthony T [place], MD 07/25/14 1704  Toy BakerAnthony T Dailey Buccheri, MD 07/25/14 [phone]

## 2014-07-25 NOTE — ED Notes (Signed)
Ortho tech notified.  

## 2014-07-25 NOTE — ED Provider Notes (Signed)
4:47 PM Patient seen for R knee dislocation. [facility] from [facility] at shift change. Dislocation reduced by previous team. Seen by Dr. Eulah PontMurphy as well.   Currently pending MRI/MRA to eval for vascular injury, ligamentous injury.   If neg, will discharge to home with crutches and knee immobilizer, follow-up.    11:06 PM Patient has been stable during ED stay. There was a delay in getting the MRA read by radiology.   Discussed MRA findings with radiologist. MRA is not able to determine if there is any subtle injury to the intimal lining of vasculature. To determine this, patient needs CT angio or angiography.   Discussed with Dr. Loretha StaplerWofford. Spoke with Dr. Eulah PontMurphy who defers to vascular surgery. Vascular has not been involved in this case to this point. After discussion with Dr. Loretha StaplerWofford, will proceed with CT angiography to further characterize the vasculature.    1:32 AM CT angio neg. Patient cleared to go home. D/c to home with pain medication, knee immobilizer/crutches, ortho f/u.   BP 132/55 mmHg  Pulse 82  Temp(Src) 98.3 F (36.8 C) (Axillary)  Resp 16  SpO2 98%   Renne CriglerJoshua Curvin Hunger, PA-C 07/26/14 0133  Candyce ChurnJohn David Wofford III, MD 07/27/14 [phone]

## 2014-07-25 NOTE — Progress Notes (Signed)
Orthopedic Tech Progress Note Patient Details:  Daralene MilchJacob C Sprecher 02/27/1966 [id number] Per verbal order of Dr. Eulah PontMurphy, applied Velcro knee immobilizer to RLE.  Pulses, sensation, motion intact before and after application.  Capillary refill less than 2 seconds before and after application.  Ortho Devices Type of Ortho Device: Knee Immobilizer Ortho Device/Splint Location: RLE Ortho Device/Splint Interventions: Application   Lesle ChrisGilliland, Jru Pense L 07/25/2014, 11:03 PM

## 2014-07-25 NOTE — ED Notes (Signed)
Patient transported to X-ray 

## 2014-07-25 NOTE — ED Notes (Addendum)
Per EMS, pt comes in from falling on ice with a knee injury. Pt A&OS4, NAD noted. Pt did not hit head. Pt leg is deformed and c/o 8/10 knee pain. Right lower extremity in a brace. Pt has had a problems with the right knee before. VSS. 18gIV started in left forearm, Fentanyl given IV.

## 2014-07-25 NOTE — ED Provider Notes (Signed)
CSN: [id number]     Arrival date & time 07/25/14  1416 History   First MD Initiated Contact with Patient 07/25/14 1441     Chief Complaint  Patient presents with  . Knee Injury    right    Blake Gray is 49 y.o. male with a history of bipolar disorder and schizophrenia who presents emergency department after a slip and fall on ice earlier today injuring his right knee. Patient reports he slipped on ice and his leg went in front of him. He denies hitting his head or loss of consciousness. The patient received 100 mg of fentanyl in route by EMS. He reports initially his pain was 8 out of 10 but it has improved after fentanyl by EMS. The patient denies numbness or tingling. Patient denies fevers, chills, recent illness, loss of consciousness, head injury, lightheadedness, dizziness, hip, back or neck pain.  (Consider location/radiation/quality/duration/timing/severity/associated sxs/prior Treatment) HPI  Past Medical History  Diagnosis Date  . Bipolar 1 disorder    Past Surgical History  Procedure Laterality Date  . Mouth surgery    . Tonsillectomy     History reviewed. No pertinent family history. History  Substance Use Topics  . Smoking status: Current Some Day Smoker  . Smokeless tobacco: Not on file  . Alcohol Use: Yes    Review of Systems  Constitutional: Negative for fever and chills.  HENT: Negative for ear pain.   Eyes: Negative for pain and visual disturbance.  Respiratory: Negative for cough and shortness of breath.   Cardiovascular: Negative for chest pain and palpitations.  Gastrointestinal: Negative for nausea, vomiting and abdominal pain.  Musculoskeletal: Negative for back pain and neck pain.       Right knee pain   Neurological: Negative for dizziness, syncope, light-headedness, numbness and headaches.      Allergies  Aspirin  Home Medications   Prior to Admission medications   Medication Sig Start Date End Date Taking? Authorizing Provider   benztropine (COGENTIN) 1 MG tablet Take 1 mg by mouth daily.  07/08/13   Historical Provider, MD  haloperidol decanoate (HALDOL DECANOATE) 100 MG/ML injection Inject 100 mg into the muscle every 28 (twenty-eight) days. First of the month usually    Historical Provider, MD  ibuprofen (ADVIL,MOTRIN) 600 MG tablet Take 1 tablet (600 mg total) by mouth every 6 (six) hours as needed. 07/19/14   Renne Crigler, PA-C  methocarbamol (ROBAXIN) 500 MG tablet Take 1 tablet (500 mg total) by mouth 2 (two) times daily as needed. 12/08/13   Garlon Hatchet, PA-C  zolpidem (AMBIEN) 5 MG tablet Take 1 tablet (5 mg total) by mouth at bedtime as needed for sleep. 09/20/13   Tatyana A Kirichenko, PA-C   BP 116/92 mmHg  Pulse 102  Temp(Src) 98.3 F (36.8 C) (Axillary)  Resp 16  SpO2 100% Physical Exam  Constitutional: He is oriented to person, place, and time. He appears well-developed and well-nourished. No distress.  HENT:  Head: Normocephalic and atraumatic.  Mouth/Throat: Oropharynx is clear and moist. No oropharyngeal exudate.  Eyes: Conjunctivae are normal. Pupils are equal, round, and reactive to light. Right eye exhibits no discharge. Left eye exhibits no discharge.  Neck: Neck supple.  Cardiovascular: Normal rate, regular rhythm, normal heart sounds and intact distal pulses.   Bilateral radial pulses are intact. Bilateral posterior tibialis and dorsalis pedis pulses are intact.  Pulmonary/Chest: Effort normal and breath sounds normal. No respiratory distress. He has no wheezes. He has no rales.  Abdominal:  Soft. There is no tenderness.  Musculoskeletal: He exhibits edema and tenderness.  Deformity and tenderness to palpation to right knee. Able to move toes and foot distal to deformity. Sensation intact. Dorsalis pedis and posterior tibialis pulses intact.   Lymphadenopathy:    He has no cervical adenopathy.  Neurological: He is alert and oriented to person, place, and time. Coordination normal.   Sensation intact in bilateral feet.   Skin: Skin is warm and dry. No rash noted. He is not diaphoretic. No erythema. No pallor.  Psychiatric: He has a normal mood and affect. His behavior is normal.  Nursing note and vitals reviewed.   ED Course  Procedures (including critical care time) Labs Review Labs Reviewed  CBC WITH DIFFERENTIAL/PLATELET - Abnormal; Notable for the following:    Neutrophils Relative % 86 (*)    Neutro Abs 8.8 (*)    Lymphocytes Relative 9 (*)    All other components within normal limits  BASIC METABOLIC PANEL - Abnormal; Notable for the following:    Glucose, Bld 104 (*)    All other components within normal limits    Imaging Review Dg Knee Complete 4 Views Right  07/25/2014   CLINICAL DATA:  Slipped on ice with twisting injury and fall  EXAM: RIGHT KNEE - COMPLETE 4+ VIEW  COMPARISON:  07/19/2014  FINDINGS: Three views of the right knee demonstrate anterior dislocation of the tibia. No displaced fracture is evident.  IMPRESSION: Right knee dislocation   Electronically Signed   By: Ellery Plunk M.D.   On: 07/25/2014 15:23     EKG Interpretation None      Filed Vitals:   07/25/14 1500 07/25/14 1545 07/25/14 1600 07/25/14 1626  BP: 120/70 122/80 130/81 116/92  Pulse: 84 85 83 102  Temp:      TempSrc:      Resp: SpO2: 99% 97% 99% 100%     MDM   Meds given in ED:  Medications  midazolam (VERSED) 2 MG/2ML injection (not administered)  fentaNYL (SUBLIMAZE) 0.05 MG/ML injection (not administered)  flumazenil (ROMAZICON) 0.5 MG/5ML injection (not administered)  etomidate (AMIDATE) injection 10 mg (10 mg Intravenous Given 07/25/14 1612)  etomidate (AMIDATE) injection (2 mg Intravenous Given 07/25/14 1630)  fentaNYL (SUBLIMAZE) injection (50 mcg Intravenous Given 07/25/14 1639)  midazolam (VERSED) injection (4 mg Intravenous Given 07/25/14 1641)  flumazenil (ROMAZICON) injection (0.4 mg Intravenous Given 07/25/14 1643)    New  Prescriptions   No medications on file    Final diagnoses:  Knee dislocation  Knee dislocation, right, initial encounter   This is a 49 year old male with a history of bipolar disorder and schizophrenia who presents to the emergency department after a slip and fall on ice injuring his right knee. Patient has a right knee dislocation. Patient is neurovascularly intact in his right distal extremity. CBC and BMP are unremarkable. Patient discussed with orthopedic surgeon Dr. Eulah Pont who came to the ER to reduce right knee. Conscious sedation performed by Dr. Freida Busman. Spoke with radiologist Dr. Bonnielee Haff for recommendations on imaging. Dr. Bonnielee Haff recommended an MRI of his knee as well as an MRA of distal thigh to his foot.  Dr. Eulah Pont reports that as long as there is no vascular compromise the patient can be discharged home in knee immobilizer and crutches and follow-up with him in two days. Patient awaiting MRI/MRA at this time. Patient care handed off to [facility] and Dr. Loretha Stapler at shift change.   This patient was  discussed with and evaluated by Dr. Freida BusmanAllen who agrees with assessment and plan.     Lawana ChambersWilliam Duncan Andrej Spagnoli, PA-C 07/25/14 [phone]

## 2014-07-25 NOTE — Consult Note (Signed)
ORTHOPAEDIC CONSULTATION  REQUESTING PHYSICIAN: Leota Jacobsen, MD  Chief Complaint: right knee injury  HPI: Blake Gray is a 49 y.o. male who complains of being intoxicated and slipping on the ice.   Past Medical History  Diagnosis Date  . Bipolar 1 disorder    Past Surgical History  Procedure Laterality Date  . Mouth surgery    . Tonsillectomy     History   Social History  . Marital Status: Single    Spouse Name: N/A    Number of Children: N/A  . Years of Education: N/A   Social History Main Topics  . Smoking status: Current Some Day Smoker  . Smokeless tobacco: None  . Alcohol Use: Yes  . Drug Use: No  . Sexual Activity: None   Other Topics Concern  . None   Social History Narrative   History reviewed. No pertinent family history. Allergies  Allergen Reactions  . Aspirin Hives and Rash    Break out rash   Prior to Admission medications   Medication Sig Start Date End Date Taking? Authorizing Provider  benztropine (COGENTIN) 1 MG tablet Take 1 mg by mouth daily.  07/08/13   Historical Provider, MD  haloperidol decanoate (HALDOL DECANOATE) 100 MG/ML injection Inject 100 mg into the muscle every 28 (twenty-eight) days. First of the month usually    Historical Provider, MD  ibuprofen (ADVIL,MOTRIN) 600 MG tablet Take 1 tablet (600 mg total) by mouth every 6 (six) hours as needed. 07/19/14   Carlisle Cater, PA-C  methocarbamol (ROBAXIN) 500 MG tablet Take 1 tablet (500 mg total) by mouth 2 (two) times daily as needed. 12/08/13   Larene Pickett, PA-C  zolpidem (AMBIEN) 5 MG tablet Take 1 tablet (5 mg total) by mouth at bedtime as needed for sleep. 09/20/13   Tatyana A Kirichenko, PA-C   Dg Knee Complete 4 Views Right  07/25/2014   CLINICAL DATA:  Slipped on ice with twisting injury and fall  EXAM: RIGHT KNEE - COMPLETE 4+ VIEW  COMPARISON:  07/19/2014  FINDINGS: Three views of the right knee demonstrate anterior dislocation of the tibia. No displaced fracture is  evident.  IMPRESSION: Right knee dislocation   Electronically Signed   By: Andreas Newport M.D.   On: 07/25/2014 15:23    Positive ROS: All other systems have been reviewed and were otherwise negative with the exception of those mentioned in the HPI and as above.  Labs cbc  Recent Labs  07/25/14 1544  WBC 10.1  HGB 14.8  HCT 42.4  PLT 240    Labs inflam No results for input(s): CRP in the last 72 hours.  Invalid input(s): ESR  Labs coag No results for input(s): INR, PTT in the last 72 hours.  Invalid input(s): PT   Recent Labs  07/25/14 1544  NA 137  K 3.9  CL 105  CO2 25  GLUCOSE 104*  BUN 11  CREATININE 0.90  CALCIUM 9.2    Physical Exam: Filed Vitals:   07/25/14 1626  BP: 116/92  Pulse: 102  Temp:   Resp: 16   General: Alert, no acute distress Cardiovascular: No pedal edema Respiratory: No cyanosis, no use of accessory musculature GI: No organomegaly, abdomen is soft and non-tender Skin: No lesions in the area of chief complaint other than those listed below in MSK exam.  Neurologic: Sensation intact distally Psychiatric: Patient is competent for consent with normal mood and affect Lymphatic: No axillary or cervical lymphadenopathy  MUSCULOSKELETAL:  RLE: obvious knee deformity and effusion. Distally 2+DP, SILT SP/DP, decreased sensation to his plantar foot.  Improved PT pulse post reduction Compartments soft Other extremities are atraumatic with painless ROM and NVI.  Assessment: R knee dislocation  Plan: I performed a closed reduction of his right knee. I will obtain an MRI of his knee per ED staff a MR arthrogram will also be done. Knee immobilizer full-time partial weightbearing with crutches until follow-up. F/u in 1wk     PROCEDURE: After appropriate timeout sedation was provided by the ED staff I performed a closed reduction of his right knee with a satisfying reduction. Postreduction MRI to follow. He had improvement of his  posterior tibial pulses after reduction continued to have some decreased sensation in his plantar foot. He was placed in a long-leg splint after reduction.  Edmonia Lynch, D, MD Cell [phone]   07/25/2014 4:50 PM

## 2014-07-25 NOTE — Sedation Documentation (Signed)
Pt transported to MRI 

## 2014-07-25 NOTE — ED Provider Notes (Addendum)
Medical screening examination/treatment/procedure(s) were conducted as a shared visit with non-physician practitioner(s) and myself.  I personally evaluated the patient during the encounter.   EKG Interpretation None     Patient here with right knee injury after slipping on the ice. X-ray shows right knee dislocation. Will perform conscious sedation relocated knee and have ordered arteriogram of the knee  Please the above note for the sedation documentation   Blake BakerAnthony T Lemoyne Nestor, MD 07/25/14 1537  Blake BakerAnthony T Malcom Selmer, MD 07/25/14 1650  Blake BakerAnthony T Nirali Magouirk, MD 07/25/14 1700  Blake BakerAnthony T Raeli Wiens, MD 07/25/14 [phone]

## 2014-07-25 NOTE — ED Notes (Signed)
Pt accompanied by RN to MRI

## 2014-07-25 NOTE — Progress Notes (Signed)
Orthopedic Tech Progress Note Patient Details:  Blake MilchJacob C Gray 08/24/1965 [id number] Assisted Dr. Eulah PontMurphy with reduction of knee dislocation. Patient ID: Blake MilchJacob C Gray, male   DOB: 07/16/1965, 49 y.o.   MRN: [id number]   Lesle ChrisGilliland, Elfriede Bonini L 07/25/2014, 5:26 PM

## 2014-07-25 NOTE — ED Notes (Signed)
Notified ortho tech ?

## 2014-07-26 DIAGNOSIS — S83104A Unspecified dislocation of right knee, initial encounter: Secondary | ICD-10-CM | POA: Diagnosis not present

## 2014-07-26 MED ORDER — OXYCODONE-ACETAMINOPHEN 5-325 MG PO TABS: 1.0000 | ORAL_TABLET | Freq: Four times a day (QID) | ORAL | Status: DC | PRN

## 2014-07-26 NOTE — Discharge Instructions (Signed)
Please read and follow all provided instructions.  Your diagnoses today include:  1. Knee dislocation, right, initial encounter   2. Knee dislocation     Tests performed today include:  MRI of your knee - shows torn ligaments in your knee  MRI/CT of blood vessels in leg - shows healthy arteries  Vital signs. See below for your results today.   Medications prescribed:   Percocet (oxycodone/acetaminophen) - narcotic pain medication  DO NOT drive or perform any activities that require you to be awake and alert because this medicine can make you drowsy. BE VERY CAREFUL not to take multiple medicines containing Tylenol (also called acetaminophen). Doing so can lead to an overdose which can damage your liver and cause liver failure and possibly death.  Take any prescribed medications only as directed.  Home care instructions:   Follow any educational materials contained in this packet  Follow R.I.C.E. Protocol:  R - rest your injury   I  - use ice on injury without applying directly to skin  C - compress injury with bandage or splint  E - elevate the injury as much as possible  Follow-up instructions: Please follow-up with Dr. Eulah PontMurphy this week.   Use crutches or walker to help get around.   Return instructions:   Please return if your toes or feet are numb or tingling, appear gray or blue, or you have severe pain (also elevate the leg and loosen splint or wrap if you were given one)  Please return to the Emergency Department if you experience worsening symptoms.   Please return if you have any other emergent concerns.  Additional Information:  Your vital signs today were: BP 132/55 mmHg   Pulse 82   Temp(Src) 98.3 F (36.8 C) (Axillary)   Resp 16   SpO2 98% If your blood pressure (BP) was elevated above 135/85 this visit, please have this repeated by your doctor within one month. --------------

## 2014-08-03 DIAGNOSIS — S83104D Unspecified dislocation of right knee, subsequent encounter: Secondary | ICD-10-CM | POA: Diagnosis not present

## 2014-08-10 DIAGNOSIS — F209 Schizophrenia, unspecified: Secondary | ICD-10-CM | POA: Diagnosis not present

## 2014-08-31 DIAGNOSIS — S83104D Unspecified dislocation of right knee, subsequent encounter: Secondary | ICD-10-CM | POA: Diagnosis not present

## 2014-09-07 DIAGNOSIS — F209 Schizophrenia, unspecified: Secondary | ICD-10-CM | POA: Diagnosis not present

## 2014-10-11 DIAGNOSIS — F209 Schizophrenia, unspecified: Secondary | ICD-10-CM | POA: Diagnosis not present

## 2014-11-08 DIAGNOSIS — F209 Schizophrenia, unspecified: Secondary | ICD-10-CM | POA: Diagnosis not present

## 2014-12-06 DIAGNOSIS — F209 Schizophrenia, unspecified: Secondary | ICD-10-CM | POA: Diagnosis not present

## 2015-01-03 DIAGNOSIS — F209 Schizophrenia, unspecified: Secondary | ICD-10-CM | POA: Diagnosis not present

## 2015-01-31 DIAGNOSIS — F209 Schizophrenia, unspecified: Secondary | ICD-10-CM | POA: Diagnosis not present

## 2015-02-08 DIAGNOSIS — F209 Schizophrenia, unspecified: Secondary | ICD-10-CM | POA: Diagnosis not present

## 2015-02-28 DIAGNOSIS — F209 Schizophrenia, unspecified: Secondary | ICD-10-CM | POA: Diagnosis not present

## 2015-03-28 DIAGNOSIS — F209 Schizophrenia, unspecified: Secondary | ICD-10-CM | POA: Diagnosis not present

## 2015-04-21 DIAGNOSIS — F209 Schizophrenia, unspecified: Secondary | ICD-10-CM | POA: Diagnosis not present

## 2015-04-24 ENCOUNTER — Encounter (HOSPITAL_COMMUNITY): Payer: Self-pay | Admitting: Anesthesiology

## 2015-04-25 DIAGNOSIS — F209 Schizophrenia, unspecified: Secondary | ICD-10-CM | POA: Diagnosis not present

## 2015-05-23 DIAGNOSIS — F209 Schizophrenia, unspecified: Secondary | ICD-10-CM | POA: Diagnosis not present

## 2015-06-20 DIAGNOSIS — F209 Schizophrenia, unspecified: Secondary | ICD-10-CM | POA: Diagnosis not present

## 2015-07-07 DIAGNOSIS — F209 Schizophrenia, unspecified: Secondary | ICD-10-CM | POA: Diagnosis not present

## 2015-08-22 DIAGNOSIS — F209 Schizophrenia, unspecified: Secondary | ICD-10-CM | POA: Diagnosis not present

## 2015-09-19 DIAGNOSIS — F209 Schizophrenia, unspecified: Secondary | ICD-10-CM | POA: Diagnosis not present

## 2015-10-17 DIAGNOSIS — F209 Schizophrenia, unspecified: Secondary | ICD-10-CM | POA: Diagnosis not present

## 2015-10-26 DIAGNOSIS — F209 Schizophrenia, unspecified: Secondary | ICD-10-CM | POA: Diagnosis not present

## 2015-11-14 DIAGNOSIS — F209 Schizophrenia, unspecified: Secondary | ICD-10-CM | POA: Diagnosis not present

## 2015-12-12 DIAGNOSIS — F209 Schizophrenia, unspecified: Secondary | ICD-10-CM | POA: Diagnosis not present

## 2016-01-09 DIAGNOSIS — F209 Schizophrenia, unspecified: Secondary | ICD-10-CM | POA: Diagnosis not present

## 2016-01-14 ENCOUNTER — Encounter (HOSPITAL_COMMUNITY): Payer: Self-pay | Admitting: Emergency Medicine

## 2016-01-14 ENCOUNTER — Emergency Department (HOSPITAL_COMMUNITY)
Admission: EM | Admit: 2016-01-14 | Discharge: 2016-01-14 | Disposition: A | Discharge status: Home / Self Care | Payer: Medicare Other | Attending: Emergency Medicine | Admitting: Emergency Medicine

## 2016-01-14 DIAGNOSIS — Z79899 Other long term (current) drug therapy: Secondary | ICD-10-CM | POA: Diagnosis not present

## 2016-01-14 DIAGNOSIS — F17200 Nicotine dependence, unspecified, uncomplicated: Secondary | ICD-10-CM | POA: Insufficient documentation

## 2016-01-14 DIAGNOSIS — R066 Hiccough: Secondary | ICD-10-CM | POA: Diagnosis not present

## 2016-01-14 DIAGNOSIS — R05 Cough: Secondary | ICD-10-CM | POA: Diagnosis present

## 2016-01-14 DIAGNOSIS — J069 Acute upper respiratory infection, unspecified: Secondary | ICD-10-CM | POA: Diagnosis not present

## 2016-01-14 MED ORDER — PROMETHAZINE-DM 6.25-15 MG/5ML PO SYRP: 5.0000 mL | ORAL_SOLUTION | Freq: Four times a day (QID) | ORAL | Status: DC | PRN

## 2016-01-14 MED ORDER — GUAIFENESIN 200 MG PO TABS: 200.0000 mg | ORAL_TABLET | ORAL | Status: DC | PRN

## 2016-01-14 MED ORDER — BENZONATATE 100 MG PO CAPS
200.0000 mg | ORAL_CAPSULE | Freq: Once | ORAL | Status: AC
  Administered 2016-01-14: 200 mg via ORAL
  Filled 2016-01-14: qty 2

## 2016-01-14 NOTE — ED Notes (Signed)
Patient verbalized understanding of discharge instructions and denies any further needs or questions at this time. VS stable. Patient ambulatory with steady gait, declined wheelchair - escorted to ED entrance.  

## 2016-01-14 NOTE — ED Provider Notes (Signed)
CSN: [id number]     Arrival date & time 01/14/16  2114 History  By signing my name below, I, Blake Gray, attest that this documentation has been prepared under the direction and in the presence of [employer], [place]. Electronically Signed: Evon Gray, ED Scribe. 01/14/2016. 9:34 PM.    Chief Complaint  Patient presents with  . Cough   The history is provided by the patient. No language interpreter was used.   HPI Comments: ROHIL LESCH is a 50 y.o. male who presents to the Emergency Department complaining of cough onset 4 days prior. Pt reports associated rhinorrhea. Denies any medications PTA. He doesn't report any modifying factors. Denies SOB, wheezing, CP ear pain, sinus pressure, neck stiffness, vomiting, fever or HA. Denies any recent sick contacts. Pt states is requesting AIDS testing however he denies any known exposure or any other sxs at this time.   Past Medical History  Diagnosis Date  . Bipolar 1 disorder [facility])    Past Surgical History  Procedure Laterality Date  . Mouth surgery    . Tonsillectomy     No family history on file. Social History  Substance Use Topics  . Smoking status: Current Some Day Smoker  . Smokeless tobacco: None  . Alcohol Use: Yes    Review of Systems  Constitutional: Negative for fever.  HENT: Positive for rhinorrhea. Negative for congestion and ear pain.   Respiratory: Positive for cough. Negative for shortness of breath and wheezing.   Neurological: Negative for headaches.      Allergies  Aspirin  Home Medications   Prior to Admission medications   Medication Sig Start Date End Date Taking? Authorizing Provider  benztropine (COGENTIN) 1 MG tablet Take 1 mg by mouth daily.  07/08/13   Historical Provider, MD  guaiFENesin 200 MG tablet Take 1 tablet (200 mg total) by mouth every 4 (four) hours as needed for cough or to loosen phlegm. 01/14/16   Barrett Henle, PA-C  haloperidol decanoate (HALDOL DECANOATE)  100 MG/ML injection Inject 100 mg into the muscle every 28 (twenty-eight) days. First of the month usually    Historical Provider, MD  ibuprofen (ADVIL,MOTRIN) 600 MG tablet Take 1 tablet (600 mg total) by mouth every 6 (six) hours as needed. Patient not taking: Reported on 07/25/2014 07/19/14   Renne Crigler, PA-C  methocarbamol (ROBAXIN) 500 MG tablet Take 1 tablet (500 mg total) by mouth 2 (two) times daily as needed. Patient not taking: Reported on 07/25/2014 12/08/13   Garlon Hatchet, PA-C  oxyCODONE-acetaminophen (PERCOCET/ROXICET) 5-325 MG per tablet Take 1-2 tablets by mouth every 6 (six) hours as needed for severe pain. 07/26/14   Renne Crigler, PA-C  promethazine-dextromethorphan (PROMETHAZINE-DM) 6.25-15 MG/5ML syrup Take 5 mLs by mouth 4 (four) times daily as needed for cough. 01/14/16   Barrett Henle, PA-C  zolpidem (AMBIEN) 5 MG tablet Take 1 tablet (5 mg total) by mouth at bedtime as needed for sleep. Patient not taking: Reported on 07/25/2014 09/20/13   Tatyana Kirichenko, PA-C   BP 128/71 mmHg  Pulse 75  Temp(Src) 98.1 F (36.7 C) (Oral)  Resp 18  SpO2 100%   Physical Exam  Constitutional: He is oriented to person, place, and time. He appears well-developed and well-nourished.  HENT:  Head: Normocephalic and atraumatic.  Right Ear: Tympanic membrane and ear canal normal.  Left Ear: Tympanic membrane and ear canal normal.  Nose: Nose normal. Right sinus exhibits no maxillary sinus tenderness and no frontal sinus  tenderness. Left sinus exhibits no maxillary sinus tenderness and no frontal sinus tenderness.  Mouth/Throat: Uvula is midline, oropharynx is clear and moist and mucous membranes are normal. No oropharyngeal exudate, posterior oropharyngeal edema, posterior oropharyngeal erythema or tonsillar abscesses.  Postnasal drip.   Eyes: Conjunctivae and EOM are normal. Right eye exhibits no discharge. Left eye exhibits no discharge. No scleral icterus.  Cardiovascular:  Normal rate, regular rhythm, normal heart sounds and intact distal pulses.   Pulmonary/Chest: Effort normal and breath sounds normal.  Abdominal: Soft. Bowel sounds are normal. There is no tenderness.  Musculoskeletal: He exhibits no edema.  Neurological: He is alert and oriented to person, place, and time.  Skin: Skin is warm and dry.  Nursing note and vitals reviewed.   ED Course  Procedures (including critical care time) DIAGNOSTIC STUDIES: Oxygen Saturation is 100% on RA, normal by my interpretation.    COORDINATION OF CARE: 9:33 PM-Discussed treatment plan with pt at bedside and pt agreed to plan.    Labs Review Labs Reviewed - No data to display  Imaging Review No results found.    EKG Interpretation None      MDM   Final diagnoses:  URI (upper respiratory infection)   Pt presents with cough and rhinorrhea. VSS. Exam unremarkable, lungs CTAB, no signs of respiratory distress. Patients symptoms are consistent with URI, likely viral etiology. I do not suspect pneumonia and do not feel that any further workup or imaging is warranted at this time. Discussed that antibiotics are not indicated for viral infections. Pt will be discharged with symptomatic treatment.  Verbalizes understanding and is agreeable with plan. Discussed with patient options regarding further evaluation and information for STD exposure at the health Department. Advised patient to follow up with the health Department tomorrow morning. I do not feel that any emergent testing is warranted at this time due to patient denying any known exposure or having any symptoms. Pt is hemodynamically stable & in NAD prior to dc. Discussed return precautions with patient.  I personally performed the services described in this documentation, which was scribed in my presence. The recorded information has been reviewed and is accurate.      Satira Sarkicole Elizabeth BramanNadeau, [place]-C 01/14/16 2143  Vanetta MuldersScott Zackowski, MD 01/15/16  [phone]

## 2016-01-14 NOTE — Discharge Instructions (Signed)
Take your medications as prescribed. I recommend following up with the health Department for any further evaluation or concerns have regarding STDs or HIV. Please follow up with a primary care provider from the Resource Guide provided below in 1 week as needed if your symptoms have not improved. Please return to the Emergency Department if symptoms worsen or new onset of fever, neck stiffness, difficulty breathing, wheezing, coughing up blood, chest pain, facial swelling.   [facility] [place] [facility] tests for every STD. There [place] 9 other clinics [place], but only 3 of them offer such comprehensive testing.  Services Offered: Network engineerTesting Services  Chlamydia Testing Conventional Blood HIV Testing Gonorrhea Testing Hepatitis B Testing Hepatitis C Testing Herpes Testing Syphilis Testing TB Testing  Vaccines And Treatments:  Hepatitis B Vaccine Hepatitis A Vaccine HPV Vaccine TB Treatment Gynecological Care Family Planning  Prevention Services:  HIV Test Counseling HIV/AIDS Prevention Education Safer Sex Education Speakers [place] STD Prevention/Education TB Prevention Education  Languages Spoken: English Spanish  Hours of Operation: Note: Please contact the organization for hours of operation. Disclaimer: Hours of peration change frequently. Please contact the organization to verify. Appointment Required: Yes  Contact Information: Phone [phone] Other Phones [phone] Product manager(Domestic Fax)  Address: [address] [place], [place] [id number]  Website: [email or web address]

## 2016-01-14 NOTE — ED Notes (Signed)
PA-C to see and assess patient before RN assessment. See PA note. 

## 2016-01-14 NOTE — ED Notes (Signed)
Pt to triage via PTAR> C/o non-productive cough x 4 days when he is lying down.  Also request STD check.

## 2016-01-18 DIAGNOSIS — F209 Schizophrenia, unspecified: Secondary | ICD-10-CM | POA: Diagnosis not present

## 2016-01-24 ENCOUNTER — Emergency Department (HOSPITAL_COMMUNITY)
Admission: EM | Admit: 2016-01-24 | Discharge: 2016-01-24 | Discharge status: Absent without leave | Payer: Medicare Other

## 2016-01-24 ENCOUNTER — Encounter: Payer: Self-pay | Admitting: Critical Care Medicine

## 2016-01-24 ENCOUNTER — Ambulatory Visit (HOSPITAL_COMMUNITY)
Admission: RE | Admit: 2016-01-24 | Discharge: 2016-01-24 | Disposition: A | Discharge status: Home / Self Care | Payer: Medicare Other | Source: Ambulatory Visit | Attending: Critical Care Medicine | Admitting: Critical Care Medicine

## 2016-01-24 ENCOUNTER — Ambulatory Visit: Payer: Medicare Other | Attending: Critical Care Medicine | Admitting: Critical Care Medicine

## 2016-01-24 VITALS — BP 108/67 | HR 86 | Temp 98.0°F | Resp 20 | Ht 65.0 in | Wt 172.0 lb

## 2016-01-24 DIAGNOSIS — R059 Cough, unspecified: Secondary | ICD-10-CM

## 2016-01-24 DIAGNOSIS — Z79899 Other long term (current) drug therapy: Secondary | ICD-10-CM | POA: Diagnosis not present

## 2016-01-24 DIAGNOSIS — R05 Cough: Secondary | ICD-10-CM | POA: Insufficient documentation

## 2016-01-24 DIAGNOSIS — J302 Other seasonal allergic rhinitis: Secondary | ICD-10-CM | POA: Insufficient documentation

## 2016-01-24 DIAGNOSIS — E079 Disorder of thyroid, unspecified: Secondary | ICD-10-CM

## 2016-01-24 DIAGNOSIS — F17210 Nicotine dependence, cigarettes, uncomplicated: Secondary | ICD-10-CM | POA: Insufficient documentation

## 2016-01-24 NOTE — Progress Notes (Signed)
Patient is here for ED FU  Patient denies pain at this time.  Patient has not taken medication today. Patient has eaten today.

## 2016-01-24 NOTE — Progress Notes (Signed)
Subjective:    Patient ID: Blake Gray, male    DOB: 05-Jan-1966, 50 y.o.   MRN: [id number]  HPI 01/24/2016 Pt here for f/u from [facility]P  7/16 Seen for rhinorrhea.   Pt c/o URI symptoms.  Pt notes every 2-3 yrs gets this.  Ex smoker.  Now no real cough, now nose is better.  Mucinex helps.  No real mucus to cough up.  No chest pain.  No wheezing.  No DOE or dyspnea at rest.  No edema in feet  Past Medical History:  Diagnosis Date  . Bipolar 1 disorder (HCC)      History reviewed. No pertinent family history.   Social History   Social History  . Marital status: Single    Spouse name: N/A  . Number of children: N/A  . Years of education: N/A   Occupational History  . Not on file.   Social History Main Topics  . Smoking status: Current Some Day Smoker    Types: Cigarettes  . Smokeless tobacco: Current User  . Alcohol use Yes  . Drug use: No  . Sexual activity: Not on file   Other Topics Concern  . Not on file   Social History Narrative  . No narrative on file     Allergies  Allergen Reactions  . Aspirin Hives and Rash    Break out rash     Outpatient Medications Prior to Visit  Medication Sig Dispense Refill  . guaiFENesin 200 MG tablet Take 1 tablet (200 mg total) by mouth every 4 (four) hours as needed for cough or to loosen phlegm. 30 suppository 0  . haloperidol decanoate (HALDOL DECANOATE) 100 MG/ML injection Inject 100 mg into the muscle every 28 (twenty-eight) days. First of the month usually    . ibuprofen (ADVIL,MOTRIN) 600 MG tablet Take 1 tablet (600 mg total) by mouth every 6 (six) hours as needed. (Patient not taking: Reported on 07/25/2014) 30 tablet 0  . benztropine (COGENTIN) 1 MG tablet Take 1 mg by mouth daily.     . methocarbamol (ROBAXIN) 500 MG tablet Take 1 tablet (500 mg total) by mouth 2 (two) times daily as needed. (Patient not taking: Reported on 07/25/2014) 14 tablet 0  . oxyCODONE-acetaminophen (PERCOCET/ROXICET) 5-325 MG per tablet Take 1-2  tablets by mouth every 6 (six) hours as needed for severe pain. 20 tablet 0  . promethazine-dextromethorphan (PROMETHAZINE-DM) 6.25-15 MG/5ML syrup Take 5 mLs by mouth 4 (four) times daily as needed for cough. 118 mL 0  . zolpidem (AMBIEN) 5 MG tablet Take 1 tablet (5 mg total) by mouth at bedtime as needed for sleep. (Patient not taking: Reported on 07/25/2014) 5 tablet 0   No facility-administered medications prior to visit.      Review of Systems  Constitutional: Negative for fever.  HENT: Positive for postnasal drip and rhinorrhea. Negative for congestion, sore throat and trouble swallowing.   Eyes: Negative.   Respiratory: Negative.   Cardiovascular: Negative.   Gastrointestinal: Negative.        Objective:   Physical Exam Vitals:   01/24/16 1213  BP: 108/67  Pulse: 86  Resp: 20  Temp: 98 F (36.7 C)  TempSrc: Oral  SpO2: 97%  Weight: 172 lb (78 kg)  Height: 5\' 5"  (1.651 m)    Gen: Pleasant, well-nourished, in no distress,  normal affect  ENT: No lesions,  mouth clear,  oropharynx clear, no postnasal drip  Neck: No JVD, no TMG, no carotid bruits  Lungs: No use of accessory muscles, no dullness to percussion, clear without rales or rhonchi  Cardiovascular: RRR, heart sounds normal, no murmur or gallops, no peripheral edema  Abdomen: soft and NT, no HSM,  BS normal  Musculoskeletal: No deformities, no cyanosis or clubbing  Neuro: alert, non focal  Skin: Warm, no lesions or rashes  No results found.        Assessment & Plan:  I personally reviewed all images and lab data in the [facility] system as well as any outside material available during this office visit and agree with the  radiology impressions.   Other seasonal allergic rhinitis Seasonal rhintis Stable  prn mucinex  Cough Chronic cough Plan  chk cxr No other med change  Thyroid dysfunction Hx of thyroid dx Obtain pcp f/u   Blake Gray was seen today for follow-up.  Diagnoses and all orders  for this visit:  Cough -     DG Chest 2 View; Future  Other seasonal allergic rhinitis  Thyroid dysfunction

## 2016-01-24 NOTE — Assessment & Plan Note (Signed)
Seasonal rhintis Stable  prn mucinex

## 2016-01-24 NOTE — Assessment & Plan Note (Signed)
Hx of thyroid dx Obtain pcp f/u

## 2016-01-24 NOTE — Patient Instructions (Signed)
A chest xray will be obtained No change in medications A primary care visit will be obtained

## 2016-01-24 NOTE — ED Notes (Signed)
Patient was to go directly to xray

## 2016-01-24 NOTE — Assessment & Plan Note (Signed)
Chronic cough Plan  chk cxr No other med change

## 2016-02-06 DIAGNOSIS — F209 Schizophrenia, unspecified: Secondary | ICD-10-CM | POA: Diagnosis not present

## 2016-03-05 DIAGNOSIS — F209 Schizophrenia, unspecified: Secondary | ICD-10-CM | POA: Diagnosis not present

## 2016-04-04 DIAGNOSIS — F209 Schizophrenia, unspecified: Secondary | ICD-10-CM | POA: Diagnosis not present

## 2016-04-05 DIAGNOSIS — F209 Schizophrenia, unspecified: Secondary | ICD-10-CM | POA: Diagnosis not present

## 2016-05-02 DIAGNOSIS — F209 Schizophrenia, unspecified: Secondary | ICD-10-CM | POA: Diagnosis not present

## 2016-05-30 DIAGNOSIS — F209 Schizophrenia, unspecified: Secondary | ICD-10-CM | POA: Diagnosis not present

## 2016-06-20 DIAGNOSIS — F209 Schizophrenia, unspecified: Secondary | ICD-10-CM | POA: Diagnosis not present

## 2016-06-26 IMAGING — CR DG KNEE COMPLETE 4+V*R*
4 series · 4 of 4 positions shown · non-contrast
Comparison: 08/25/2006

CLINICAL DATA: Slipped on floor last night. Fall. Knee pain.
Initial encounter

EXAM:
RIGHT KNEE - COMPLETE 4+ VIEW

[t knee ap right]
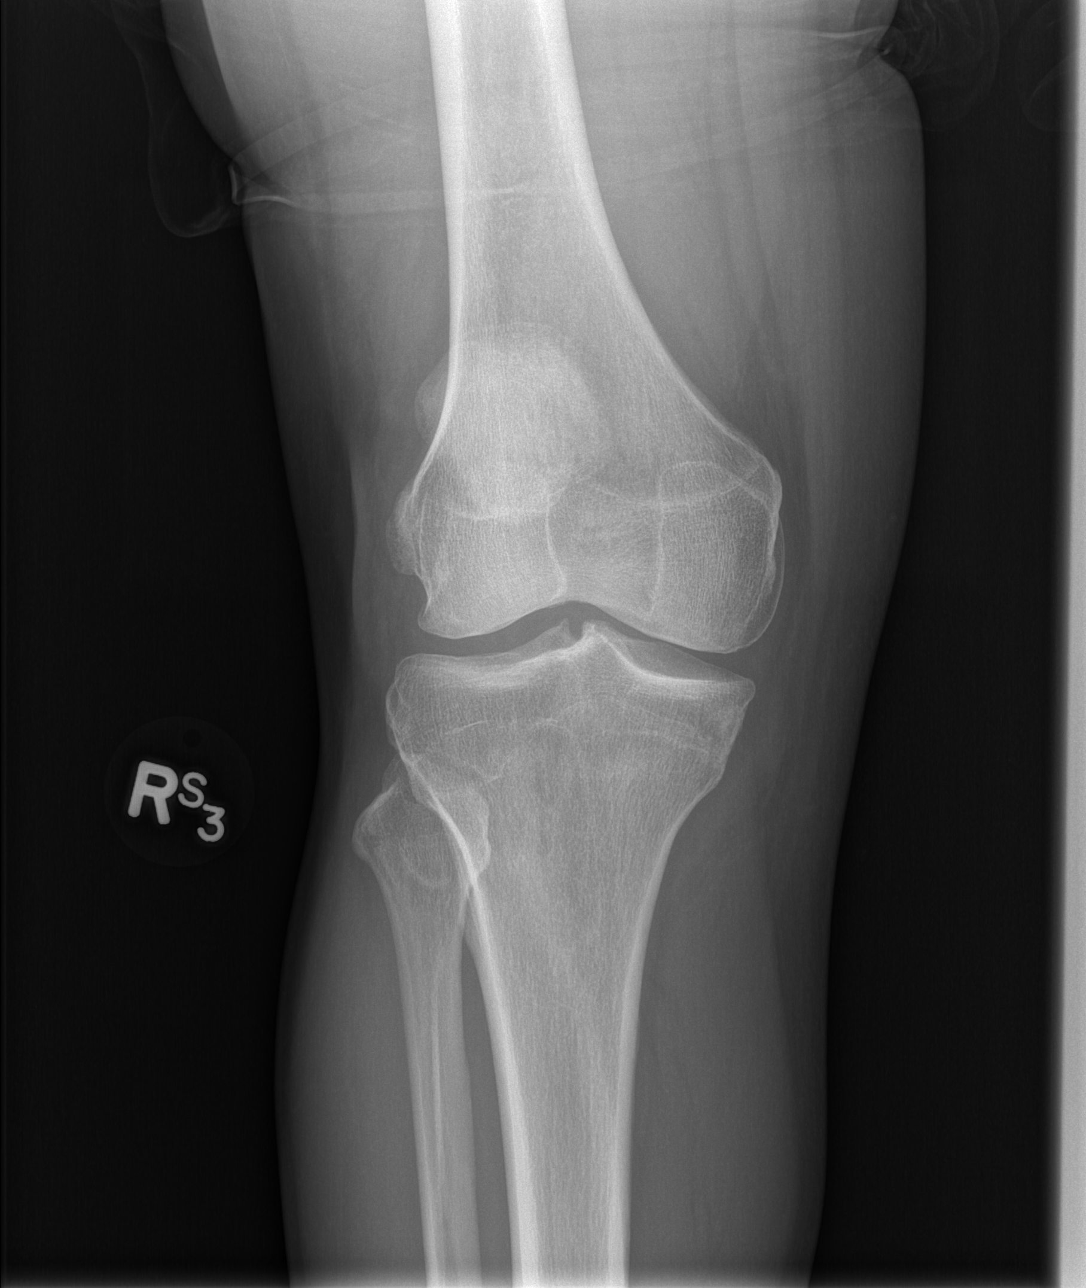

[t knee obl right (1 of 2)]
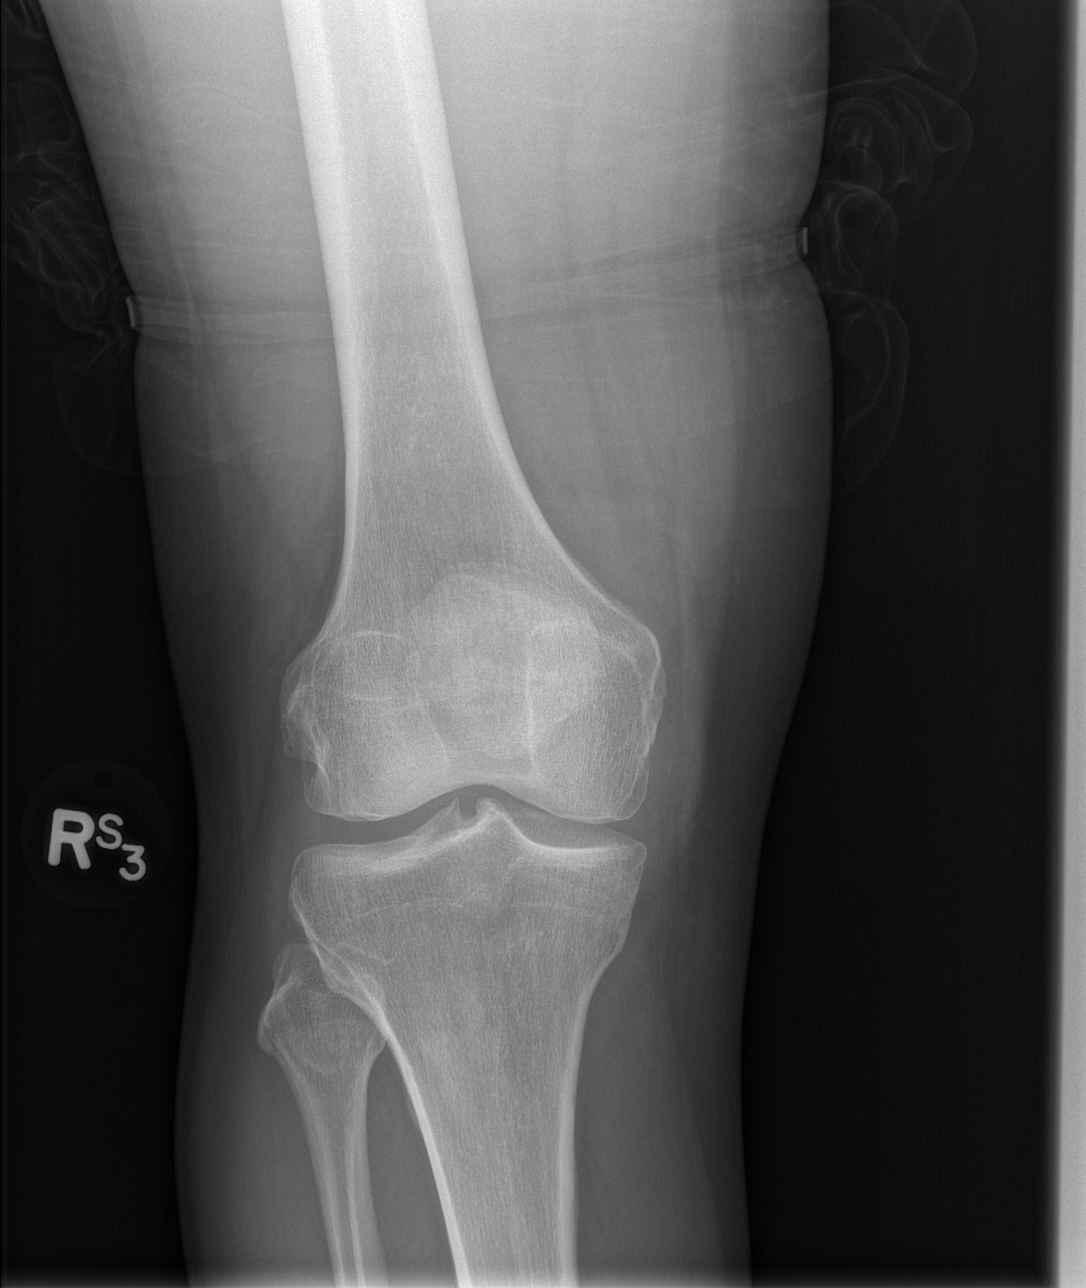

[t knee obl right (2 of 2)]
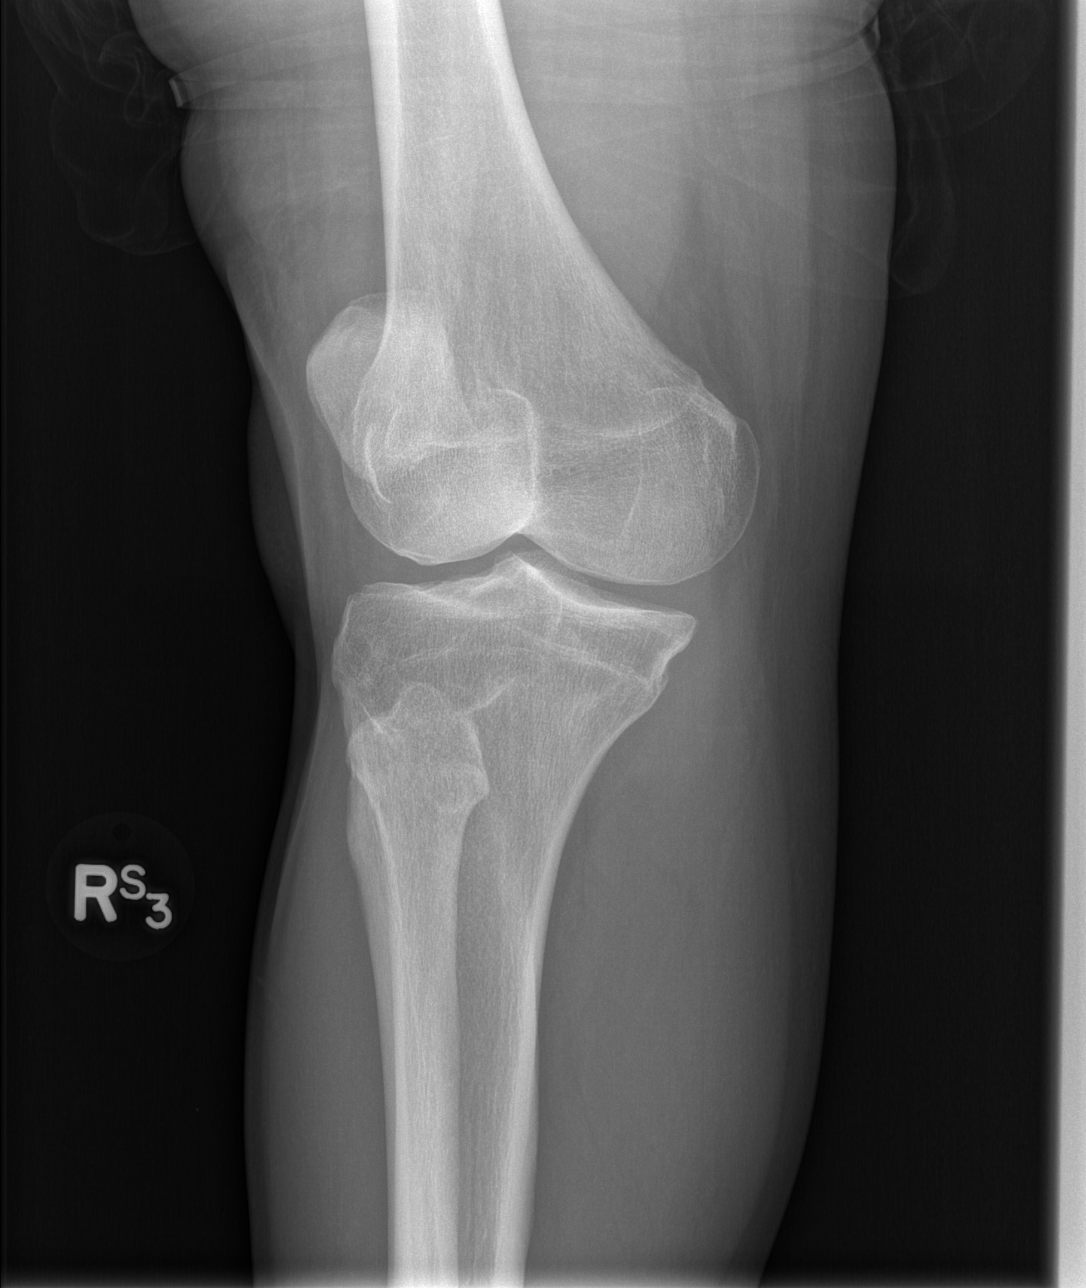

[t knee lat right]
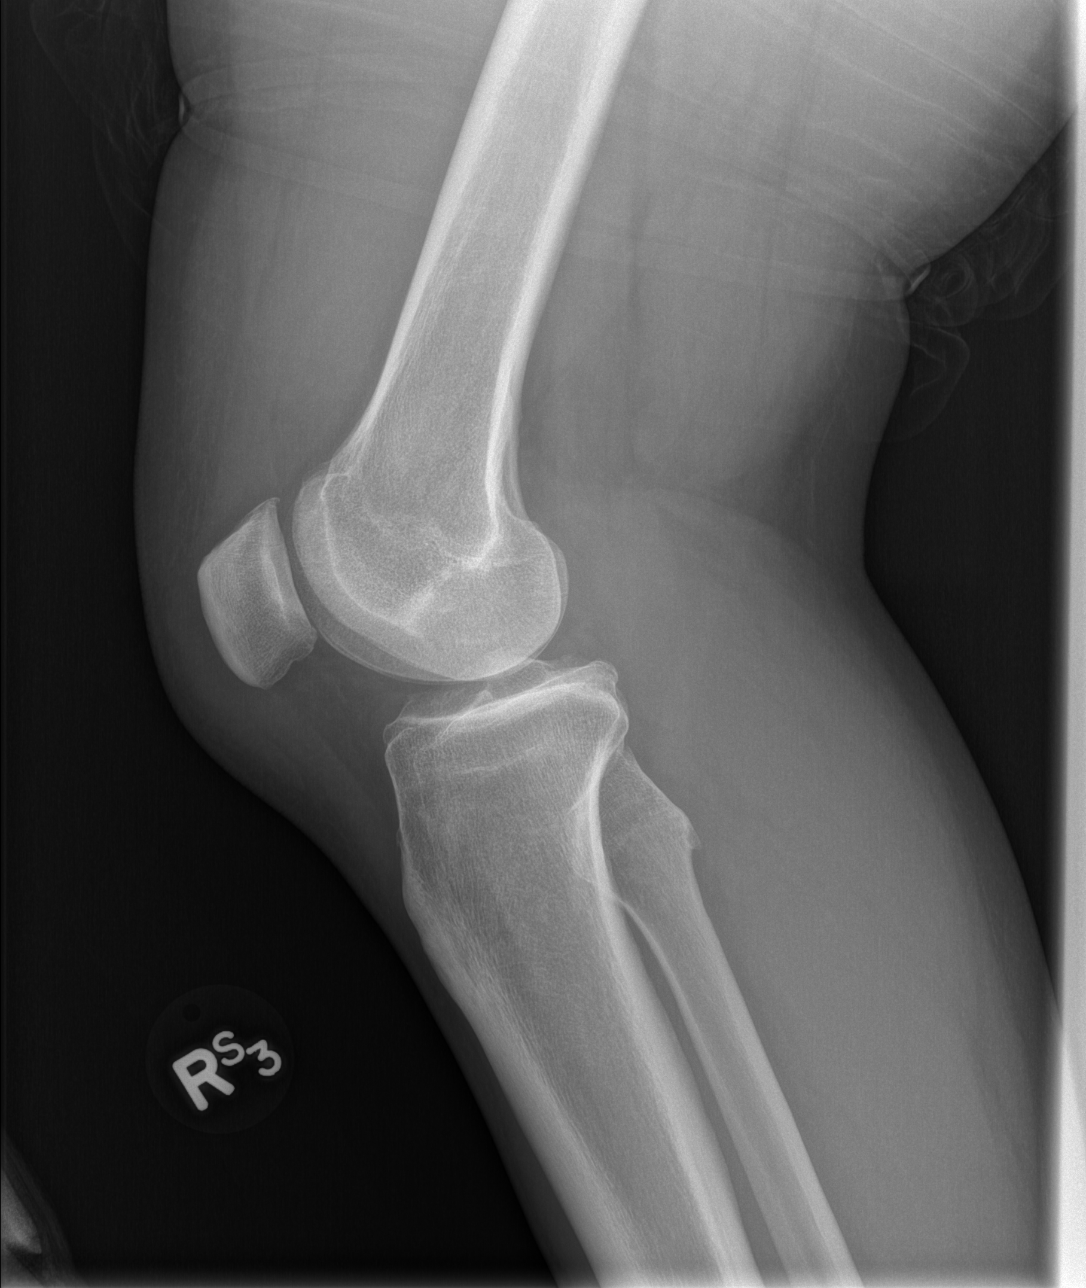

[4 of 4 positions shown; findings below may reference images not displayed]

FINDINGS: Normal alignment. No fracture. Mild patellar spurring. Mild
irregularity of the lateral femoral condyle. No significant joint
space narrowing medially or laterally. Negative for mass lesion. No
significant effusion.
IMPRESSION: No acute abnormality.

## 2016-06-27 DIAGNOSIS — F209 Schizophrenia, unspecified: Secondary | ICD-10-CM | POA: Diagnosis not present

## 2016-07-25 DIAGNOSIS — F209 Schizophrenia, unspecified: Secondary | ICD-10-CM | POA: Diagnosis not present

## 2016-08-12 ENCOUNTER — Encounter (HOSPITAL_COMMUNITY): Payer: Self-pay | Admitting: Emergency Medicine

## 2016-08-12 ENCOUNTER — Emergency Department (HOSPITAL_COMMUNITY)
Admission: EM | Admit: 2016-08-12 | Discharge: 2016-08-12 | Disposition: A | Discharge status: Home / Self Care | Payer: Medicare Other | Attending: Emergency Medicine | Admitting: Emergency Medicine

## 2016-08-12 DIAGNOSIS — S39012A Strain of muscle, fascia and tendon of lower back, initial encounter: Secondary | ICD-10-CM | POA: Diagnosis not present

## 2016-08-12 DIAGNOSIS — Y939 Activity, unspecified: Secondary | ICD-10-CM | POA: Diagnosis not present

## 2016-08-12 DIAGNOSIS — Y929 Unspecified place or not applicable: Secondary | ICD-10-CM | POA: Insufficient documentation

## 2016-08-12 DIAGNOSIS — Z79899 Other long term (current) drug therapy: Secondary | ICD-10-CM | POA: Diagnosis not present

## 2016-08-12 DIAGNOSIS — R531 Weakness: Secondary | ICD-10-CM | POA: Diagnosis not present

## 2016-08-12 DIAGNOSIS — S3992XA Unspecified injury of lower back, initial encounter: Secondary | ICD-10-CM | POA: Diagnosis present

## 2016-08-12 DIAGNOSIS — X58XXXA Exposure to other specified factors, initial encounter: Secondary | ICD-10-CM | POA: Diagnosis not present

## 2016-08-12 DIAGNOSIS — T148XXA Other injury of unspecified body region, initial encounter: Secondary | ICD-10-CM

## 2016-08-12 DIAGNOSIS — M549 Dorsalgia, unspecified: Secondary | ICD-10-CM | POA: Diagnosis not present

## 2016-08-12 DIAGNOSIS — Y999 Unspecified external cause status: Secondary | ICD-10-CM | POA: Diagnosis not present

## 2016-08-12 DIAGNOSIS — F17210 Nicotine dependence, cigarettes, uncomplicated: Secondary | ICD-10-CM | POA: Insufficient documentation

## 2016-08-12 HISTORY — DX: Schizophrenia, unspecified: F20.9

## 2016-08-12 MED ORDER — CYCLOBENZAPRINE HCL 5 MG PO TABS: 5.0000 mg | ORAL_TABLET | Freq: Two times a day (BID) | ORAL | 0 refills | Status: AC | PRN

## 2016-08-12 NOTE — ED Notes (Signed)
ED Provider at bedside. 

## 2016-08-12 NOTE — ED Provider Notes (Signed)
[facility]-EMERGENCY DEPT Provider Note   CSN: [id number] Arrival date & time: 08/12/16  1023  By signing my name below, I, Majel Homer, attest that this documentation has been prepared under the direction and in the presence of Teressa Lower, NP . Electronically Signed: Majel Homer, Scribe. 08/12/2016. 10:27 AM.  History   Chief Complaint Chief Complaint  Patient presents with  . Back Pain   The history is provided by the patient. No language interpreter was used.   HPI Comments: Blake Gray is a 51 y.o. male brought in by EMS to the Emergency Department complaining of gradually worsening, "severe" lower back pain that began ~1 week ago. Pt reports his pain is exacerbated when bending down and ambulating. He notes he has taken Advil for his pain without relief. He denies any numbness or weakness in his extremities, fever, and difficulty urinating.   Past Medical History:  Diagnosis Date  . Bipolar 1 disorder (HCC)   . Schizophrenia [facility])     Patient Active Problem List   Diagnosis Date Noted  . Cough 01/24/2016  . Other seasonal allergic rhinitis 01/24/2016  . High risk medication use 11/26/2011  . Mental retardation 11/26/2011  . Thyroid dysfunction 11/26/2011    Past Surgical History:  Procedure Laterality Date  . MOUTH SURGERY    . TONSILLECTOMY      Home Medications    Prior to Admission medications   Medication Sig Start Date End Date Taking? Authorizing Provider  guaiFENesin 200 MG tablet Take 1 tablet (200 mg total) by mouth every 4 (four) hours as needed for cough or to loosen phlegm. 01/14/16   Barrett Henle, PA-C  haloperidol decanoate (HALDOL DECANOATE) 100 MG/ML injection Inject 100 mg into the muscle every 28 (twenty-eight) days. First of the month usually    Historical Provider, MD  ibuprofen (ADVIL,MOTRIN) 600 MG tablet Take 1 tablet (600 mg total) by mouth every 6 (six) hours as needed. Patient not taking: Reported on 07/25/2014 07/19/14   Renne Crigler, PA-C    Family History History reviewed. No pertinent family history.  Social History Social History  Substance Use Topics  . Smoking status: Current Some Day Smoker    Types: Cigarettes  . Smokeless tobacco: Current User  . Alcohol use Yes   Allergies   Aspirin  Review of Systems Review of Systems  Constitutional: Negative for fever.  Genitourinary: Negative for difficulty urinating.  Musculoskeletal: Positive for back pain.  Neurological: Negative for weakness and numbness.   Physical Exam Updated Vital Signs There were no vitals taken for this visit.  Physical Exam  Constitutional: He is oriented to person, place, and time. He appears well-developed and well-nourished.  HENT:  Head: Normocephalic.  Eyes: EOM are normal.  Neck: Normal range of motion.  Pulmonary/Chest: Effort normal.  Abdominal: He exhibits no distension.  Musculoskeletal: Normal range of motion.  Right sided back pain. Full rom. Normal strength  Neurological: He is alert and oriented to person, place, and time.  Psychiatric: He has a normal mood and affect.  Nursing note and vitals reviewed.  ED Treatments / Results  DIAGNOSTIC STUDIES:    COORDINATION OF CARE:  10:37 AM Discussed treatment plan with pt at bedside and pt agreed to plan.  Labs (all labs ordered are listed, but only abnormal results are displayed) Labs Reviewed - No data to display  EKG  EKG Interpretation None       Radiology No results found.  Procedures Procedures (including critical care  time)  Medications Ordered in ED Medications - No data to display  Initial Impression / Assessment and Plan / ED Course  I have reviewed the triage vital signs and the nursing notes.  Pertinent labs & imaging results that were available during my care of the patient were reviewed by me and considered in my medical decision making (see chart for details).    No red flag symptoms. Will treat with flexeril and pt  to continued ibuprofen  I personally performed the services described in this documentation, which was scribed in my presence. The recorded information has been reviewed and is accurate.   Final Clinical Impressions(s) / ED Diagnoses   Final diagnoses:  None    New Prescriptions New Prescriptions   No medications on file     Teressa LowerVrinda Tacey Dimaggio, NP 08/12/16 1104    Benjiman CoreNathan Alenah Sarria, MD 08/12/16 1622

## 2016-08-12 NOTE — ED Triage Notes (Signed)
Per EMS: pt from home c/o lower back pain with radiation down right hip; pt ambulatory

## 2016-08-22 DIAGNOSIS — F209 Schizophrenia, unspecified: Secondary | ICD-10-CM | POA: Diagnosis not present

## 2016-08-27 DIAGNOSIS — F209 Schizophrenia, unspecified: Secondary | ICD-10-CM | POA: Diagnosis not present

## 2016-09-19 DIAGNOSIS — F209 Schizophrenia, unspecified: Secondary | ICD-10-CM | POA: Diagnosis not present

## 2016-10-17 DIAGNOSIS — F209 Schizophrenia, unspecified: Secondary | ICD-10-CM | POA: Diagnosis not present

## 2016-11-13 DIAGNOSIS — F209 Schizophrenia, unspecified: Secondary | ICD-10-CM | POA: Diagnosis not present

## 2016-11-19 DIAGNOSIS — F209 Schizophrenia, unspecified: Secondary | ICD-10-CM | POA: Diagnosis not present

## 2016-12-11 DIAGNOSIS — F209 Schizophrenia, unspecified: Secondary | ICD-10-CM | POA: Diagnosis not present

## 2017-01-08 DIAGNOSIS — F209 Schizophrenia, unspecified: Secondary | ICD-10-CM | POA: Diagnosis not present

## 2017-02-05 DIAGNOSIS — F209 Schizophrenia, unspecified: Secondary | ICD-10-CM | POA: Diagnosis not present

## 2017-02-11 ENCOUNTER — Emergency Department (HOSPITAL_COMMUNITY): Payer: Medicare Other

## 2017-02-11 ENCOUNTER — Emergency Department (HOSPITAL_COMMUNITY)
Admission: EM | Admit: 2017-02-11 | Discharge: 2017-02-11 | Disposition: A | Discharge status: Home / Self Care | Payer: Medicare Other | Attending: Emergency Medicine | Admitting: Emergency Medicine

## 2017-02-11 ENCOUNTER — Encounter (HOSPITAL_COMMUNITY): Payer: Self-pay | Admitting: Emergency Medicine

## 2017-02-11 DIAGNOSIS — R069 Unspecified abnormalities of breathing: Secondary | ICD-10-CM | POA: Diagnosis not present

## 2017-02-11 DIAGNOSIS — Z79899 Other long term (current) drug therapy: Secondary | ICD-10-CM | POA: Diagnosis not present

## 2017-02-11 DIAGNOSIS — F79 Unspecified intellectual disabilities: Secondary | ICD-10-CM | POA: Insufficient documentation

## 2017-02-11 DIAGNOSIS — F17210 Nicotine dependence, cigarettes, uncomplicated: Secondary | ICD-10-CM | POA: Insufficient documentation

## 2017-02-11 DIAGNOSIS — J069 Acute upper respiratory infection, unspecified: Secondary | ICD-10-CM | POA: Diagnosis not present

## 2017-02-11 DIAGNOSIS — R05 Cough: Secondary | ICD-10-CM

## 2017-02-11 DIAGNOSIS — R059 Cough, unspecified: Secondary | ICD-10-CM

## 2017-02-11 MED ORDER — GUAIFENESIN ER 600 MG PO TB12
600.0000 mg | ORAL_TABLET | Freq: Two times a day (BID) | ORAL | Status: DC
  Administered 2017-02-11: 600 mg via ORAL
  Filled 2017-02-11: qty 1

## 2017-02-11 MED ORDER — GUAIFENESIN ER 600 MG PO TB12: 600.0000 mg | ORAL_TABLET | Freq: Two times a day (BID) | ORAL | 0 refills | Status: AC | PRN

## 2017-02-11 MED ORDER — BENZONATATE 100 MG PO CAPS
100.0000 mg | ORAL_CAPSULE | Freq: Once | ORAL | Status: AC
  Administered 2017-02-11: 100 mg via ORAL
  Filled 2017-02-11: qty 1

## 2017-02-11 MED ORDER — BENZONATATE 100 MG PO CAPS: 100.0000 mg | ORAL_CAPSULE | Freq: Three times a day (TID) | ORAL | 0 refills | Status: AC

## 2017-02-11 NOTE — ED Triage Notes (Signed)
Pt reports having coughing and sneezing that has been ongoing for the last hour. Pt states he gets an URI every year and wants something to help his coughing and sneezing along with a chest Xray.

## 2017-02-11 NOTE — ED Notes (Signed)
Bed: WA24 Expected date:  Expected time:  Means of arrival:  Comments: 

## 2017-02-11 NOTE — Discharge Instructions (Signed)
Please attach information regarding your condition. Take Tessalon and Mucinex as needed for cough and congestion. Return to ED for chest pain, trouble breathing, fevers, coughing up blood, leg swelling.

## 2017-02-11 NOTE — ED Provider Notes (Signed)
[facility]-EMERGENCY DEPT Provider Note   CSN: [id number] Arrival date & time: 02/11/17  7829     History   Chief Complaint Chief Complaint  Patient presents with  . URI    HPI Blake Gray is a 51 y.o. male.  HPI Patient presents to ED for evaluation of productive cough, rhinorrhea for the past day. He reports similar symptoms in the past around this time of the year, and relates it to a viral URI. He also reports intermittent chills. He has not tried any medications prior to arrival because he states he is unable to afford anything at this time. He denies any chest pain, hemoptysis, shortness of breath, ear pain, sore throat, tick bites.  Past Medical History:  Diagnosis Date  . Bipolar 1 disorder (HCC)   . Schizophrenia [facility])     Patient Active Problem List   Diagnosis Date Noted  . Cough 01/24/2016  . Other seasonal allergic rhinitis 01/24/2016  . High risk medication use 11/26/2011  . Mental retardation 11/26/2011  . Thyroid dysfunction 11/26/2011    Past Surgical History:  Procedure Laterality Date  . MOUTH SURGERY    . TONSILLECTOMY         Home Medications    Prior to Admission medications   Medication Sig Start Date End Date Taking? Authorizing Provider  benzonatate (TESSALON) 100 MG capsule Take 1 capsule (100 mg total) by mouth every 8 (eight) hours. 02/11/17   Algie Cales, PA-C  cyclobenzaprine (FLEXERIL) 5 MG tablet Take 1 tablet (5 mg total) by mouth 2 (two) times daily as needed for muscle spasms. 08/12/16   Teressa Lower, NP  guaiFENesin (MUCINEX) 600 MG 12 hr tablet Take 1 tablet (600 mg total) by mouth 2 (two) times daily as needed. 02/11/17   Tausha Milhoan, PA-C  haloperidol decanoate (HALDOL DECANOATE) 100 MG/ML injection Inject 100 mg into the muscle every 28 (twenty-eight) days. First of the month usually    [provider]  ibuprofen (ADVIL,MOTRIN) 600 MG tablet Take 1 tablet (600 mg total) by mouth every 6 (six) hours as  needed. Patient not taking: Reported on 07/25/2014 07/19/14   Renne Crigler, PA-C    Family History History reviewed. No pertinent family history.  Social History Social History  Substance Use Topics  . Smoking status: Current Some Day Smoker    Types: Cigarettes  . Smokeless tobacco: Current User  . Alcohol use Yes     Allergies   Aspirin   Review of Systems Review of Systems  Constitutional: Positive for chills. Negative for fever.  HENT: Positive for rhinorrhea. Negative for ear discharge, ear pain, facial swelling, sinus pain, sinus pressure, sore throat and trouble swallowing.   Respiratory: Positive for cough. Negative for shortness of breath.   Cardiovascular: Negative for chest pain, palpitations and leg swelling.  Gastrointestinal: Negative for nausea and vomiting.  Musculoskeletal: Negative for myalgias.     Physical Exam Updated Vital Signs BP 135/86 (BP Location: Right Arm)   Pulse 90   Temp 97.6 F (36.4 C) (Oral)   Resp 18   Ht 5\' 5"  (1.651 m)   Wt 79.4 kg (175 lb)   SpO2 99%   BMI 29.12 kg/m   Physical Exam  Constitutional: He appears well-developed and well-nourished. No distress.  HENT:  Head: Normocephalic and atraumatic.  Eyes: Conjunctivae and EOM are normal. No scleral icterus.  Neck: Normal range of motion.  Cardiovascular: Normal rate, regular rhythm and normal heart sounds.   Pulmonary/Chest: Effort normal  and breath sounds normal. No respiratory distress. He has no rales.  Neurological: He is alert.  Skin: No rash noted. He is not diaphoretic.  Psychiatric: He has a normal mood and affect.  Nursing note and vitals reviewed.    ED Treatments / Results  Labs (all labs ordered are listed, but only abnormal results are displayed) Labs Reviewed - No data to display  EKG  EKG Interpretation None       Radiology Dg Chest 2 View  Result Date: 02/11/2017 CLINICAL DATA:  Productive cough. EXAM: CHEST  2 VIEW COMPARISON:   01/24/2016.  08/29/2011 . FINDINGS: Mediastinum and hilar structures are normal. Lungs are clear of acute infiltrates. Nipple shadow on the left again noted. Heart size normal. No pleural effusion or pneumothorax. Thoracic spine scoliosis and degenerative change. IMPRESSION: No acute cardiopulmonary disease. Electronically Signed   By: Maisie Fushomas  Register   On: 02/11/2017 06:48    Procedures Procedures (including critical care time)  Medications Ordered in ED Medications  guaiFENesin (MUCINEX) 12 hr tablet 600 mg (600 mg Oral Given 02/11/17 0727)  benzonatate (TESSALON) capsule 100 mg (100 mg Oral Given 02/11/17 0727)     Initial Impression / Assessment and Plan / ED Course  I have reviewed the triage vital signs and the nursing notes.  Pertinent labs & imaging results that were available during my care of the patient were reviewed by me and considered in my medical decision making (see chart for details).     Patient presents to ED for evaluation of productive cough, rhinorrhea, intermittent chills for the past day. Reports similar symptoms around this time of year every year. He has not tried any medications prior to arrival. On physical exam patient is nontoxic appearing and in no acute distress. He is afebrile. He is not tachycardic or tachypneic. He is satting at 99% on room air. We will obtain chest x-ray.  [id number]: Checks x-ray returned as negative for acute cardiopulmonary disease. We will give Tessalon and Mucinex and reassess.  [id number]: Patient states much improvement with Tessalon and Mucinex. He requests that if we are able to give him some of the medication to take home. After speaking and the nurse it appears that we are not able to do this without a possible case management consult. I informed patient of this and he declined case management consult at this time. He states that he will try to obtain the means to get medication filled. Gave strict return precautions for severe or worsening  symptoms.    Final Clinical Impressions(s) / ED Diagnoses   Final diagnoses:  Viral upper respiratory tract infection  Cough    New Prescriptions New Prescriptions   BENZONATATE (TESSALON) 100 MG CAPSULE    Take 1 capsule (100 mg total) by mouth every 8 (eight) hours.   GUAIFENESIN (MUCINEX) 600 MG 12 HR TABLET    Take 1 tablet (600 mg total) by mouth 2 (two) times daily as needed.     Dietrich PatesKhatri, Jovonte Commins, PA-C 02/11/17 0749    Dione BoozeGlick, David, MD 02/11/17 [phone]

## 2017-02-12 DIAGNOSIS — F209 Schizophrenia, unspecified: Secondary | ICD-10-CM | POA: Diagnosis not present

## 2017-03-04 DIAGNOSIS — F209 Schizophrenia, unspecified: Secondary | ICD-10-CM | POA: Diagnosis not present

## 2017-04-01 DIAGNOSIS — F209 Schizophrenia, unspecified: Secondary | ICD-10-CM | POA: Diagnosis not present

## 2017-04-22 DIAGNOSIS — F209 Schizophrenia, unspecified: Secondary | ICD-10-CM | POA: Diagnosis not present

## 2017-04-29 DIAGNOSIS — F209 Schizophrenia, unspecified: Secondary | ICD-10-CM | POA: Diagnosis not present

## 2017-05-27 DIAGNOSIS — F209 Schizophrenia, unspecified: Secondary | ICD-10-CM | POA: Diagnosis not present

## 2017-06-25 DIAGNOSIS — F209 Schizophrenia, unspecified: Secondary | ICD-10-CM | POA: Diagnosis not present

## 2017-07-10 DIAGNOSIS — F209 Schizophrenia, unspecified: Secondary | ICD-10-CM | POA: Diagnosis not present

## 2017-07-24 DIAGNOSIS — F209 Schizophrenia, unspecified: Secondary | ICD-10-CM | POA: Diagnosis not present

## 2017-08-21 DIAGNOSIS — F209 Schizophrenia, unspecified: Secondary | ICD-10-CM | POA: Diagnosis not present

## 2017-09-18 DIAGNOSIS — F209 Schizophrenia, unspecified: Secondary | ICD-10-CM | POA: Diagnosis not present

## 2017-10-09 DIAGNOSIS — F209 Schizophrenia, unspecified: Secondary | ICD-10-CM | POA: Diagnosis not present

## 2017-10-16 DIAGNOSIS — F209 Schizophrenia, unspecified: Secondary | ICD-10-CM | POA: Diagnosis not present

## 2017-11-13 DIAGNOSIS — F209 Schizophrenia, unspecified: Secondary | ICD-10-CM | POA: Diagnosis not present

## 2017-12-11 DIAGNOSIS — F209 Schizophrenia, unspecified: Secondary | ICD-10-CM | POA: Diagnosis not present

## 2017-12-24 DIAGNOSIS — F209 Schizophrenia, unspecified: Secondary | ICD-10-CM | POA: Diagnosis not present

## 2018-01-08 ENCOUNTER — Encounter (HOSPITAL_COMMUNITY): Payer: Self-pay | Admitting: *Deleted

## 2018-01-08 ENCOUNTER — Emergency Department (HOSPITAL_COMMUNITY)
Admission: EM | Admit: 2018-01-08 | Discharge: 2018-01-08 | Discharge status: Against Medical Advice | Payer: Medicare Other | Attending: Emergency Medicine | Admitting: Emergency Medicine

## 2018-01-08 DIAGNOSIS — R202 Paresthesia of skin: Secondary | ICD-10-CM | POA: Insufficient documentation

## 2018-01-08 DIAGNOSIS — Z5321 Procedure and treatment not carried out due to patient leaving prior to being seen by health care provider: Secondary | ICD-10-CM | POA: Insufficient documentation

## 2018-01-08 DIAGNOSIS — F209 Schizophrenia, unspecified: Secondary | ICD-10-CM | POA: Diagnosis not present

## 2018-01-08 NOTE — ED Notes (Signed)
Does not answer when called

## 2018-01-08 NOTE — ED Triage Notes (Signed)
Pt c/o R hand tingling intermittent ongoing for several months denies injury, + radial pulse, moves all fingers, skin intact, A&O x4

## 2018-01-08 NOTE — ED Notes (Signed)
Called pt multiple times no response.

## 2018-02-05 DIAGNOSIS — F209 Schizophrenia, unspecified: Secondary | ICD-10-CM | POA: Diagnosis not present

## 2018-04-02 DIAGNOSIS — F209 Schizophrenia, unspecified: Secondary | ICD-10-CM | POA: Diagnosis not present

## 2018-04-30 DIAGNOSIS — F209 Schizophrenia, unspecified: Secondary | ICD-10-CM | POA: Diagnosis not present

## 2018-05-26 DIAGNOSIS — F209 Schizophrenia, unspecified: Secondary | ICD-10-CM | POA: Diagnosis not present

## 2018-06-04 DIAGNOSIS — F209 Schizophrenia, unspecified: Secondary | ICD-10-CM | POA: Diagnosis not present

## 2018-06-30 DIAGNOSIS — F209 Schizophrenia, unspecified: Secondary | ICD-10-CM | POA: Diagnosis not present

## 2018-07-28 DIAGNOSIS — F209 Schizophrenia, unspecified: Secondary | ICD-10-CM | POA: Diagnosis not present

## 2018-08-25 DIAGNOSIS — F209 Schizophrenia, unspecified: Secondary | ICD-10-CM | POA: Diagnosis not present

## 2018-09-22 DIAGNOSIS — F209 Schizophrenia, unspecified: Secondary | ICD-10-CM | POA: Diagnosis not present

## 2018-10-20 DIAGNOSIS — F209 Schizophrenia, unspecified: Secondary | ICD-10-CM | POA: Diagnosis not present

## 2018-12-17 DIAGNOSIS — F209 Schizophrenia, unspecified: Secondary | ICD-10-CM | POA: Diagnosis not present

## 2019-01-05 DIAGNOSIS — F209 Schizophrenia, unspecified: Secondary | ICD-10-CM | POA: Diagnosis not present

## 2019-01-14 DIAGNOSIS — F209 Schizophrenia, unspecified: Secondary | ICD-10-CM | POA: Diagnosis not present

## 2019-01-20 IMAGING — CR DG CHEST 2V
2 series · 2 of 2 positions shown · non-contrast
Comparison: 01/24/2016.  08/29/2011 .

CLINICAL DATA: Productive cough.

EXAM:
CHEST  2 VIEW

[w chest pa]
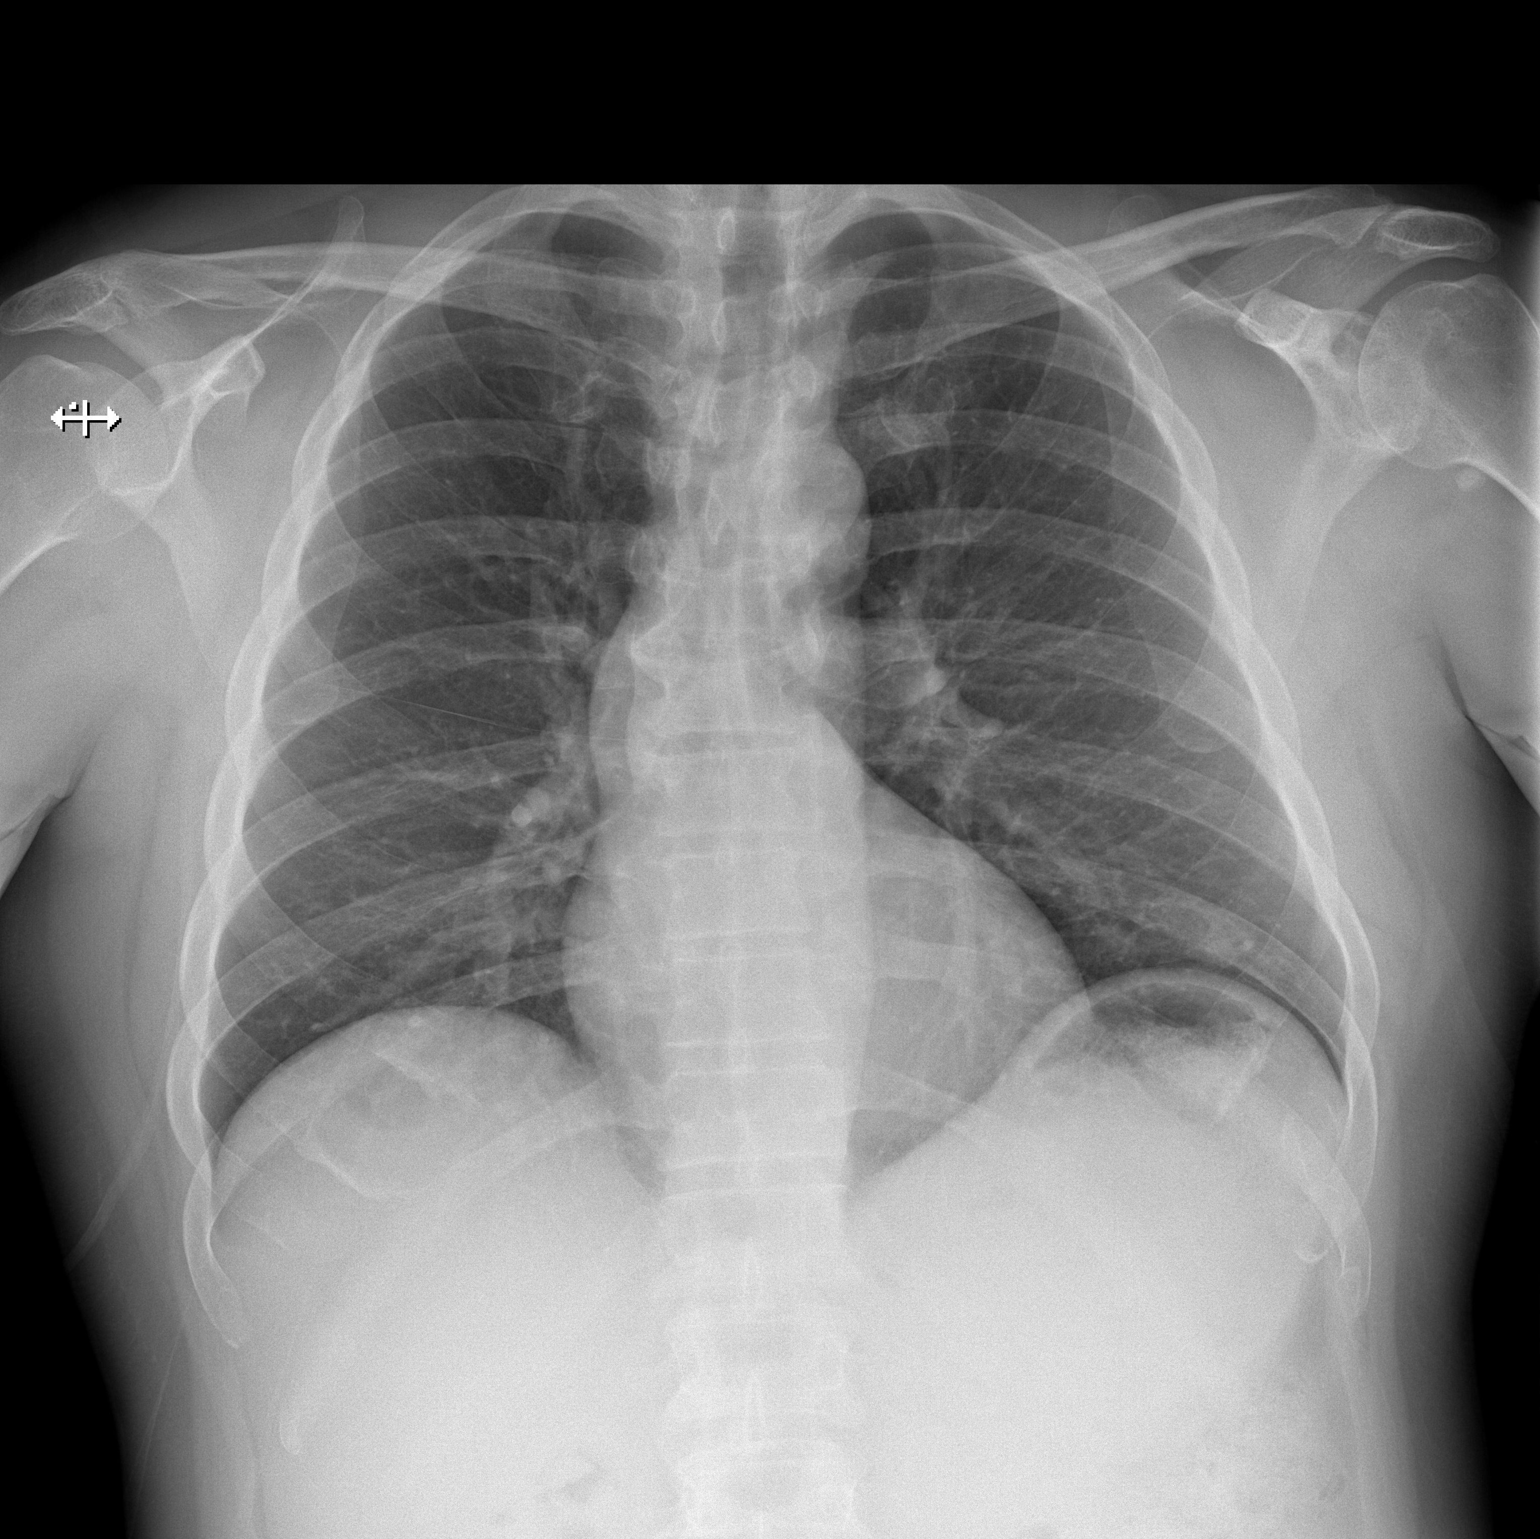

[w chest lat]
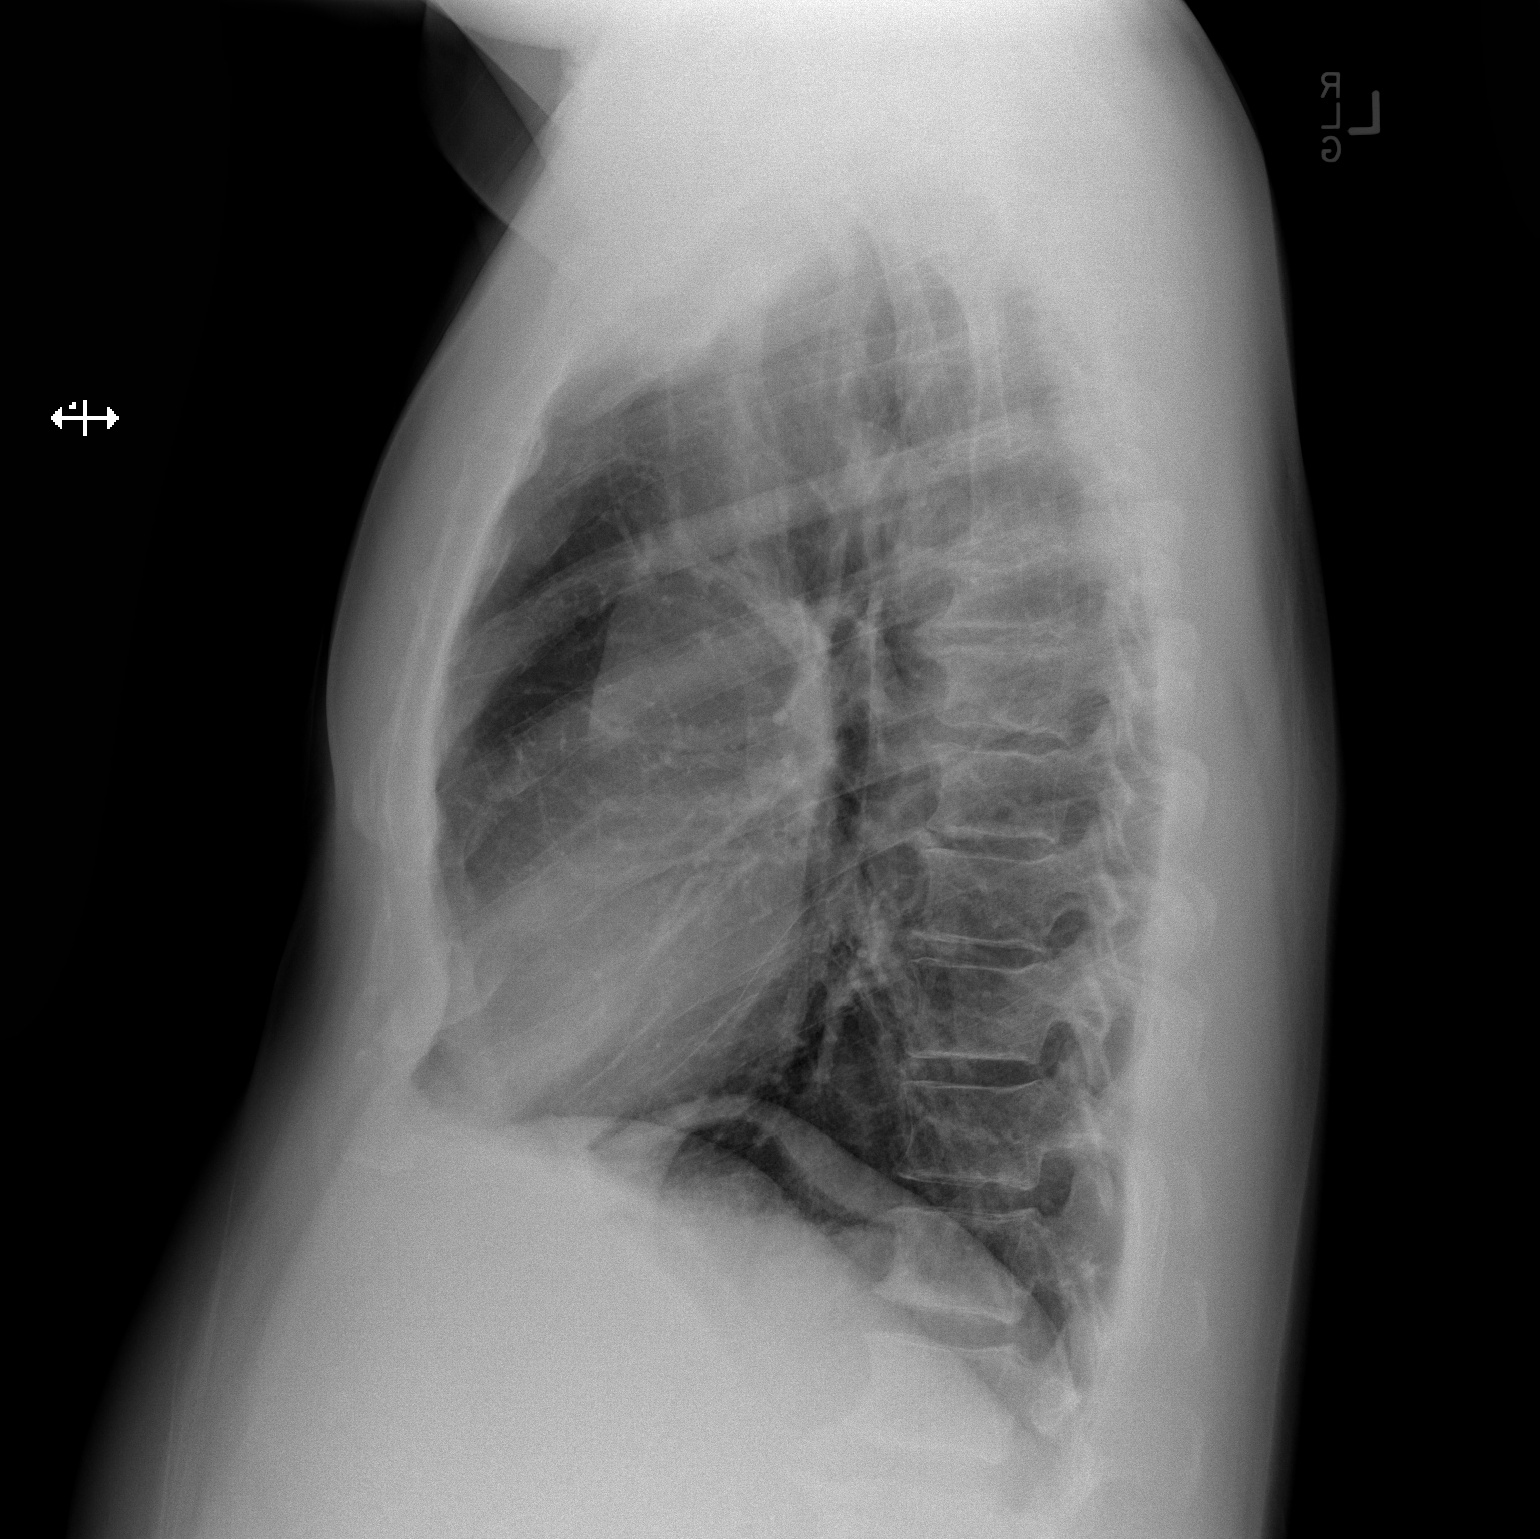

[2 of 2 positions shown; findings below may reference images not displayed]

FINDINGS: Mediastinum and hilar structures are normal. Lungs are clear of
acute infiltrates. Nipple shadow on the left again noted. Heart size
normal. No pleural effusion or pneumothorax. Thoracic spine
scoliosis and degenerative change.
IMPRESSION: No acute cardiopulmonary disease.

## 2019-02-11 DIAGNOSIS — F209 Schizophrenia, unspecified: Secondary | ICD-10-CM | POA: Diagnosis not present

## 2019-03-11 DIAGNOSIS — F209 Schizophrenia, unspecified: Secondary | ICD-10-CM | POA: Diagnosis not present

## 2019-05-06 DIAGNOSIS — F209 Schizophrenia, unspecified: Secondary | ICD-10-CM | POA: Diagnosis not present

## 2019-05-17 DIAGNOSIS — F209 Schizophrenia, unspecified: Secondary | ICD-10-CM | POA: Diagnosis not present

## 2019-08-02 DIAGNOSIS — F209 Schizophrenia, unspecified: Secondary | ICD-10-CM | POA: Diagnosis not present

## 2019-08-05 DIAGNOSIS — F209 Schizophrenia, unspecified: Secondary | ICD-10-CM | POA: Diagnosis not present

## 2022-05-13 ENCOUNTER — Emergency Department (HOSPITAL_COMMUNITY)
Admission: EM | Admit: 2022-05-13 | Discharge: 2022-05-13 | Payer: Self-pay | Attending: Emergency Medicine | Admitting: Emergency Medicine

## 2022-05-13 ENCOUNTER — Encounter (HOSPITAL_COMMUNITY): Payer: Self-pay

## 2022-05-13 ENCOUNTER — Other Ambulatory Visit: Payer: Self-pay

## 2022-05-13 DIAGNOSIS — M795 Residual foreign body in soft tissue: Secondary | ICD-10-CM | POA: Insufficient documentation

## 2022-05-13 HISTORY — DX: Other specified health status: Z78.9

## 2022-05-13 MED ORDER — LIDOCAINE HCL 1 % IJ SOLN
INTRAMUSCULAR | Status: AC
  Administered 2022-05-13: 3 mL via INTRADERMAL
  Filled 2022-05-13: qty 20

## 2022-05-13 MED ORDER — LIDOCAINE-EPINEPHRINE (PF) 2 %-1:200000 IJ SOLN: 20.0000 mL | Freq: Once | INTRAMUSCULAR | Status: DC

## 2022-05-13 NOTE — ED Provider Notes (Signed)
[facility]-EMERGENCY DEPT [facility] Emergency Department Provider Note MRN:  [id number]  Arrival date & time: 05/13/22     Chief Complaint   Foreign Body in Skin   History of Present Illness   Blake Gray is a 56 y.o. year-old male presents to the ED with chief complaint of taser barb in left chest.  Patient was tased by a bail bondsman.  He was brought in by [facility] and [facility].  He denies any other injuries.  History provided by patient.   Review of Systems  Pertinent positive and negative review of systems noted in HPI.    Physical Exam   Vitals:   05/13/22 0105 05/13/22 0115  BP: 120/80 128/84  Pulse: (!) 113 (!) 103  Resp: (!) 26 15  Temp: 98.7 F (37.1 C)   SpO2: 100% 99%    CONSTITUTIONAL:  anxious-appearing, NAD NEURO:  Alert and oriented x 3, CN 3-12 grossly intact EYES:  eyes equal and reactive ENT/NECK:  Supple, no stridor  CARDIO:  mildly tachy, appears well-perfused  PULM:  No respiratory distress,  GI/GU:  non-distended,  MSK/SPINE:  No gross deformities, no edema, moves all extremities  SKIN:  no rash, taser barb imbedded in skin over left chest   *Additional and/or pertinent findings included in MDM below  Diagnostic and Interventional Summary     Labs Reviewed - No data to display  No orders to display    Medications  lidocaine-EPINEPHrine (XYLOCAINE W/EPI) 2 %-1:200000 (PF) injection 20 mL (has no administration in time range)  lidocaine (XYLOCAINE) 1 % (with pres) injection (has no administration in time range)     Procedures  /  Critical Care .Foreign Body Removal  Date/Time: 05/13/2022 1:55 AM  Performed by: Roxy Horseman, PA-C Authorized by: Roxy Horseman, PA-C  Consent: Verbal consent obtained. Consent given by: patient Patient understanding: patient states understanding of the procedure being performed Patient consent: the patient's understanding of the procedure matches consent given Procedure consent: procedure consent  matches procedure scheduled Relevant documents: relevant documents present and verified Test results: test results available and properly labeled Site marked: the operative site was marked Imaging studies: imaging studies available Required items: required blood products, implants, devices, and special equipment available Patient identity confirmed: verbally with patient Body area: skin General location: trunk Location details: left breast Anesthesia: local infiltration  Anesthesia: Local Anesthetic: lidocaine 1% without epinephrine Anesthetic total: 1 mL  Sedation: Patient sedated: no  Patient restrained: no Patient cooperative: yes Localization method: visualized Removal mechanism: none. Dressing: dressing applied Tendon involvement: none Depth: shallow. Complexity: simple 1 objects recovered. Objects recovered: taser barb, intact Post-procedure assessment: foreign body removed Patient tolerance: patient tolerated the procedure well with no immediate complications    ED Course and Medical Decision Making  I have reviewed the triage vital signs, the nursing notes, and pertinent available records from the EMR.  Social Determinants Affecting Complexity of Care: Patient has no clinically significant social determinants affecting this chief complaint..   ED Course:    Medical Decision Making Patient here with [employer].  He has a taser barb embedded in the skin overlying the left chest.  A small amount of lidocaine was injected and the barb was removed.  The barb was intact without any evidence of fracture or retained foreign body.  Risk Prescription drug management.     Consultants: No consultations were needed in caring for this patient.   Treatment and Plan: Emergency department workup does not suggest an emergent condition requiring  admission or immediate intervention beyond  what has been performed at this time. The patient is safe for discharge and has  been  instructed to return immediately for worsening symptoms, change in  symptoms or any other concerns    Final Clinical Impressions(s) / ED Diagnoses     ICD-10-CM   1. Foreign body (FB) in soft tissue  M79.5       ED Discharge Orders     None         Discharge Instructions Discussed with and Provided to Patient:   Discharge Instructions   None      Roxy Horseman, PA-C 05/13/22 0157    Tilden Fossa, MD 05/13/22 [phone]

## 2022-05-13 NOTE — ED Triage Notes (Signed)
Pt  brought by [facility], in police custody, has a tazer barb in the left pectorial area of chest.  Lesle Reek needs to be removed and police wants him cleared due to anxiety.  Alert and oriented X3

## 2023-04-05 ENCOUNTER — Emergency Department (HOSPITAL_COMMUNITY)
Admission: EM | Admit: 2023-04-05 | Discharge: 2023-04-06 | Disposition: A | Discharge status: Home / Self Care | Payer: Medicare Other | Attending: Emergency Medicine | Admitting: Emergency Medicine

## 2023-04-05 ENCOUNTER — Emergency Department (HOSPITAL_COMMUNITY): Payer: Self-pay

## 2023-04-05 DIAGNOSIS — M79671 Pain in right foot: Secondary | ICD-10-CM | POA: Insufficient documentation

## 2023-04-05 DIAGNOSIS — R4689 Other symptoms and signs involving appearance and behavior: Secondary | ICD-10-CM | POA: Diagnosis not present

## 2023-04-05 DIAGNOSIS — Z5900 Homelessness unspecified: Secondary | ICD-10-CM

## 2023-04-05 DIAGNOSIS — F29 Unspecified psychosis not due to a substance or known physiological condition: Secondary | ICD-10-CM | POA: Insufficient documentation

## 2023-04-05 DIAGNOSIS — R000 Tachycardia, unspecified: Secondary | ICD-10-CM | POA: Insufficient documentation

## 2023-04-05 DIAGNOSIS — R443 Hallucinations, unspecified: Secondary | ICD-10-CM

## 2023-04-05 LAB — COMPREHENSIVE METABOLIC PANEL
ALT: 23 U/L (ref 0–44)
AST: 27 U/L (ref 15–41)
Albumin: 3.9 g/dL (ref 3.5–5.0)
Alkaline Phosphatase: 88 U/L (ref 38–126)
Anion gap: 8 (ref 5–15)
BUN: 9 mg/dL (ref 6–20)
CO2: 22 mmol/L (ref 22–32)
Calcium: 8.8 mg/dL — ABNORMAL LOW (ref 8.9–10.3)
Chloride: 111 mmol/L (ref 98–111)
Creatinine, Ser: 0.88 mg/dL (ref 0.61–1.24)
GFR, Estimated: 57 mL/min — ABNORMAL LOW (ref >60)
Glucose, Bld: 88 mg/dL (ref 70–99)
Potassium: 3.8 mmol/L (ref 3.5–5.1)
Sodium: 141 mmol/L (ref 135–145)
Total Bilirubin: 1 mg/dL (ref 0.3–1.2)
Total Protein: 6.9 g/dL (ref 6.5–8.1)

## 2023-04-05 LAB — CBC WITH DIFFERENTIAL/PLATELET
Abs Immature Granulocytes: 0.04 10*3/uL (ref 0.00–0.07)
Basophils Absolute: 0 10*3/uL (ref 0.0–0.1)
Basophils Relative: 0 %
Eosinophils Absolute: 0.3 10*3/uL (ref 0.0–0.5)
Eosinophils Relative: 6 %
HCT: 38.6 % — ABNORMAL LOW (ref 39.0–52.0)
Hemoglobin: 13 g/dL (ref 13.0–17.0)
Immature Granulocytes: 1 %
Lymphocytes Relative: 39 %
Lymphs Abs: 1.8 10*3/uL (ref 0.7–4.0)
MCH: 31 pg (ref 26.0–34.0)
MCHC: 33.7 g/dL (ref 30.0–36.0)
MCV: 91.9 fL (ref 80.0–100.0)
Monocytes Absolute: 0.4 10*3/uL (ref 0.1–1.0)
Monocytes Relative: 8 %
Neutro Abs: 2.1 10*3/uL (ref 1.7–7.7)
Neutrophils Relative %: 46 %
Platelets: 257 10*3/uL (ref 150–400)
RBC: 4.2 MIL/uL — ABNORMAL LOW (ref 4.22–5.81)
RDW: 13.5 % (ref 11.5–15.5)
WBC: 4.7 10*3/uL (ref 4.0–10.5)
nRBC: 0 % (ref 0.0–0.2)

## 2023-04-05 LAB — ETHANOL: Alcohol, Ethyl (B): 70 mg/dL — ABNORMAL HIGH (ref <10)

## 2023-04-05 LAB — RAPID URINE DRUG SCREEN, HOSP PERFORMED
Amphetamines: NOT DETECTED
Barbiturates: NOT DETECTED
Benzodiazepines: NOT DETECTED
Cocaine: NOT DETECTED
Opiates: NOT DETECTED
Tetrahydrocannabinol: NOT DETECTED

## 2023-04-05 LAB — CBG MONITORING, ED: Glucose-Capillary: 88 mg/dL (ref 70–99)

## 2023-04-05 MED ORDER — LORAZEPAM 1 MG PO TABS: 1.0000 mg | ORAL_TABLET | ORAL | Status: DC | PRN

## 2023-04-05 MED ORDER — ZIPRASIDONE MESYLATE 20 MG IM SOLR: 20.0000 mg | INTRAMUSCULAR | Status: DC | PRN

## 2023-04-05 MED ORDER — OLANZAPINE 5 MG PO TBDP
5.0000 mg | ORAL_TABLET | Freq: Two times a day (BID) | ORAL | Status: DC
  Administered 2023-04-05 – 2023-04-06 (×2): 5 mg via ORAL
  Filled 2023-04-05 (×2): qty 1

## 2023-04-05 MED ORDER — RISPERIDONE 0.5 MG PO TBDP: 2.0000 mg | ORAL_TABLET | Freq: Three times a day (TID) | ORAL | Status: DC | PRN

## 2023-04-05 NOTE — ED Provider Notes (Signed)
[place] EMERGENCY DEPARTMENT AT [facility] Provider Note   CSN: [id number] Arrival date & time: 04/05/23  1327     History  Chief Complaint  Patient presents with   Altered Mental Status    Blake Gray is a 57 y.o. male.  The history is provided by the patient and medical records. The history is limited by the condition of the patient.  Altered Mental Status Presenting symptoms: behavior changes and confusion   Severity:  Unable to specify Episode history:  Continuous Timing:  Constant Progression:  Waxing and waning Associated symptoms: hallucinations   Associated symptoms: no abdominal pain, no agitation, no fever, no headaches, no light-headedness, no nausea, no rash and no vomiting        Home Medications Prior to Admission medications   Not on File      Allergies    Patient has no allergy information on record.    Review of Systems   Review of Systems  Constitutional:  Negative for chills, fatigue and fever.  HENT:  Negative for congestion.   Respiratory:  Negative for cough, chest tightness and shortness of breath.   Cardiovascular:  Negative for chest pain.  Gastrointestinal:  Negative for abdominal pain, constipation, diarrhea, nausea and vomiting.  Genitourinary:  Negative for dysuria and flank pain.  Musculoskeletal:  Negative for back pain and neck pain.  Skin:  Negative for rash and wound.  Neurological:  Negative for light-headedness and headaches.  Psychiatric/Behavioral:  Positive for confusion and hallucinations. Negative for agitation.   All other systems reviewed and are negative.   Physical Exam Updated Vital Signs BP 112/85   Pulse (!) 115   Temp 98.9 F (37.2 C) (Oral)   Resp (!) 21   SpO2 95%  Physical Exam Vitals and nursing note reviewed.  Constitutional:      General: He is not in acute distress.    Appearance: He is well-developed. He is not ill-appearing, toxic-appearing or diaphoretic.  HENT:     Head:  Normocephalic and atraumatic.     Nose: Nose normal.     Mouth/Throat:     Mouth: Mucous membranes are moist.  Eyes:     Extraocular Movements: Extraocular movements intact.     Conjunctiva/sclera: Conjunctivae normal.     Pupils: Pupils are equal, round, and reactive to light.  Cardiovascular:     Rate and Rhythm: Regular rhythm. Tachycardia present.     Pulses: Normal pulses.     Heart sounds: No murmur heard. Pulmonary:     Effort: Pulmonary effort is normal. No respiratory distress.     Breath sounds: Normal breath sounds. No wheezing, rhonchi or rales.  Chest:     Chest wall: No tenderness.  Abdominal:     Palpations: Abdomen is soft.     Tenderness: There is no abdominal tenderness. There is no guarding or rebound.  Musculoskeletal:        General: Tenderness present. No swelling.     Cervical back: Neck supple.  Skin:    General: Skin is warm and dry.     Capillary Refill: Capillary refill takes less than 2 seconds.     Findings: No erythema or rash.  Neurological:     General: No focal deficit present.     Mental Status: He is alert.  Psychiatric:        Attention and Perception: He perceives auditory hallucinations.        Thought Content: Thought content does not include homicidal  or suicidal ideation.     ED Results / Procedures / Treatments   Labs (all labs ordered are listed, but only abnormal results are displayed) Labs Reviewed  CBC WITH DIFFERENTIAL/PLATELET - Abnormal; Notable for the following components:      Result Value   RBC 4.20 (*)    HCT 38.6 (*)    All other components within normal limits  COMPREHENSIVE METABOLIC PANEL - Abnormal; Notable for the following components:   Calcium 8.8 (*)    GFR, Estimated 57 (*)    All other components within normal limits  ETHANOL - Abnormal; Notable for the following components:   Alcohol, Ethyl (B) 70 (*)    All other components within normal limits  RAPID URINE DRUG SCREEN, HOSP PERFORMED  CBG  MONITORING, ED    EKG EKG Interpretation Date/Time:  Saturday April 05 2023 13:36:51 EDT Ventricular Rate:  103 PR Interval:  220 QRS Duration:  80 QT Interval:  339 QTC Calculation: 444 R Axis:   71  Text Interpretation: Sinus tachycardia Prolonged PR interval Abnormal R-wave progression, early transition no prior ECG for comparion. wandering baseline No STEMI Confirmed by Theda Belfast ([id number]) on 04/05/2023 1:57:21 PM  Radiology DG Foot Complete Right  Result Date: 04/05/2023 CLINICAL DATA:  Right lateral foot pain, chronic. Unknown date of birth. EXAM: RIGHT FOOT COMPLETE - 3+ VIEW COMPARISON:  None Available. FINDINGS: There is flexion of the second DIP joint on all views. Mild tarsometatarsal subchondral sclerosis and peripheral spurring, greatest of the fourth digit. No acute fracture dislocation. IMPRESSION: Mild tarsometatarsal osteoarthritis, greatest within the fourth digit. Electronically Signed   By: Neita Garnet M.D.   On: 04/05/2023 16:23    Procedures Procedures    Medications Ordered in ED Medications - No data to display  ED Course/ Medical Decision Making/ A&P                                 Medical Decision Making Amount and/or Complexity of Data Reviewed Radiology: ordered.    Blake Gray is a 57 y.o. male with unknown past medical history and unknown name who presents for behavioral abnormalities and right foot pain.  According to patient, he has had right foot pain for "a long time" but agrees that he is having some psychiatric issues.  He says that he has been hearing voices and would like them to stop.  He is a very difficult historian and will not answer some questions in regards to his name but does report he "does not want to go to [facility]".  He is denying any chest pain, abdominal pain, back pain, flank pain.  Denies any trauma.  Denies any headache or neck pain.  Denies fevers, chills, congestion, cough, nausea, vomiting, constipation, or  diarrhea.  He will not answer if he is having suicidal homicidal ideations but does say he has had auditory hallucinations that he wants to stop.  Of note, before I went to examine the patient he was yelling at the walls.  Per EMS report to nursing, patient was found with assured off speaking incoherently outside of an [employer].  On my exam, patient will answer some questions.  He is complaining of pain in his right lateral foot that has had for a long time.  There was some mild tenderness there but he had intact sensation and strength.  He can move all extremities.  He is missing numerous teeth  and is difficult to understand with speech.  Lungs were clear and chest nontender.  Abdomen nontender.  He is talking to people in the walls.  He is responding to external stimuli.  Patient reports he does not want to leave and wants help, will hold on IVC at this time.  Will consult TTS but he will get some screening labs.  Will get x-ray of his right foot due to the pain there.  He denies trauma.  Anticipate medical clearance and psychiatric consultation for recommendations.      4:39 PM Medical clearance workup significant for elevated EtOH but otherwise reassuring.  No evidence of acute foot fracture.  Patient is healthy medically clear for psychiatric evaluation and management.  Patient does not have any home meds in the system.  Patient will await psychiatric recommendations.          Final Clinical Impression(s) / ED Diagnoses Final diagnoses:  Hallucinations  Abnormal behavior  Right foot pain     Clinical Impression: 1. Hallucinations   2. Abnormal behavior   3. Right foot pain     Disposition: Medically clear awaiting psychiatric evaluation and recommendations.  This note was prepared with assistance of Conservation officer, historic buildings. Occasional wrong-word or sound-a-like substitutions may have occurred due to the inherent limitations of voice recognition software.       Klayten Jolliff, Canary Brim, MD 04/05/23 2233

## 2023-04-05 NOTE — ED Notes (Signed)
Patient will not respond when asked for name.Nurse gave patient urine specimen container; patient provided sample directly into specimen container.  Patient requests apple juice and sandwich. These items were provided.

## 2023-04-05 NOTE — Progress Notes (Signed)
LCSW Progress Note  [id number]   Blake Gray  04/05/2023  7:18 PM    Inpatient Behavioral Health Placement  Pt meets inpatient criteria per , NP. There are no available beds within [facility] system per [facility] Rosey Bath, RN. CSW/ Disposition needs information on pt such as name, DOB, and insurance coverage for placement.   Maryjean Ka, MSW, [facility] 04/05/2023 7:18 PM

## 2023-04-05 NOTE — ED Notes (Signed)
Patient resting in bed, did take meds and have snack tonight. No acute distress noted.

## 2023-04-05 NOTE — Consult Note (Signed)
[facility] ED ASSESSMENT   Reason for Consult: Patient talking to the walls in the room and says he is hearing voices.  He will not answer many questions other than say he "does not want to go to [facility]".  Referring Physician: Dr. Rush Landmark Patient Identification: Blake Gray MRN:  [id number] ED Chief Complaint: Psychosis [facility])  Diagnosis:  Principal Problem:   Psychosis [facility])   ED Assessment Time Calculation: Start Time: 1600 Stop Time: 1630 Total Time in Minutes (Assessment Completion): 30   Subjective: Blake Gray is a 57 y.o. male patient admitted with altered mental status. Patient found [address] in front of the [employer] without a shirt on speaking incoherent sentences. Patient can not tell [place] his name or birthday.    HPI: Blake Gray, 57 y.o., male patient seen face to face by this provider, consulted with Dr. Jannifer Franklin; and chart reviewed on 04/05/23.  On evaluation Blake Gray is laying in bed in no acute distress. He is alert, unable to assess orientation and patient just laughs when provider ask questions and mumbles incoherently. Patient does not have teeth in and very difficult to understand patient.  Patient behavior is bizarre. Unable to assess if patient is having any suicidal/self-harm/homicidal ideation, or paranoia, as he laughs or begins to attempt to rap incoherently when provider or RN speaks.  Patient unable to give [place] his name at this time.   Past Psychiatric History: unknown   Risk to Self or Others: unknown  [place] Scale:   AIMS:  , , ,  ,   ASAM:    Substance Abuse:     Past Medical History:  Past Medical History:  Diagnosis Date   No pertinent past medical history    No past surgical history on file. Family History: No family history on file. Family Psychiatric  History: unknown  Social History:  Social History   Substance and Sexual Activity  Alcohol Use None     Social History   Substance and Sexual Activity  Drug Use Not on file     Social History   Socioeconomic History   Marital status: Not on file    Spouse name: Not on file   Number of children: Not on file   Years of education: Not on file   Highest education level: Not on file  Occupational History   Not on file  Tobacco Use   Smoking status: Not on file   Smokeless tobacco: Not on file  Substance and Sexual Activity   Alcohol use: Not on file   Drug use: Not on file   Sexual activity: Not on file  Other Topics Concern   Not on file  Social History Narrative   Not on file   Social Determinants of Health   Financial Resource Strain: Not on file  Food Insecurity: Not on file  Transportation Needs: Not on file  Physical Activity: Not on file  Stress: Not on file  Social Connections: Not on file      Allergies:  Not on File  Labs:  Results for orders placed or performed during the hospital encounter of 04/05/23 (from the past 48 hour(s))  CBG monitoring, ED     Status: None   Collection Time: 04/05/23  1:38 PM  Result Value Ref Range   Glucose-Capillary 88 70 - 99 mg/dL    Comment: Glucose reference range applies only to samples taken after fasting for at least 8 hours.  CBC with Differential  Status: Abnormal   Collection Time: 04/05/23  1:50 PM  Result Value Ref Range   WBC 4.7 4.0 - 10.5 K/uL   RBC 4.20 (L) 4.22 - 5.81 MIL/uL   Hemoglobin 13.0 13.0 - 17.0 g/dL   HCT 13.0 (L) 86.5 - 78.4 %   MCV 91.9 80.0 - 100.0 fL   MCH 31.0 26.0 - 34.0 pg   MCHC 33.7 30.0 - 36.0 g/dL   RDW 69.6 29.5 - 28.4 %   Platelets 257 150 - 400 K/uL   nRBC 0.0 0.0 - 0.2 %   Neutrophils Relative % 46 %   Neutro Abs 2.1 1.7 - 7.7 K/uL   Lymphocytes Relative 39 %   Lymphs Abs 1.8 0.7 - 4.0 K/uL   Monocytes Relative 8 %   Monocytes Absolute 0.4 0.1 - 1.0 K/uL   Eosinophils Relative 6 %   Eosinophils Absolute 0.3 0.0 - 0.5 K/uL   Basophils Relative 0 %   Basophils Absolute 0.0 0.0 - 0.1 K/uL   Immature Granulocytes 1 %   Abs Immature Granulocytes  0.04 0.00 - 0.07 K/uL    Comment: Performed at [facility], [address], [place], [place] [address]  Comprehensive metabolic panel     Status: Abnormal   Collection Time: 04/05/23  1:50 PM  Result Value Ref Range   Sodium 141 135 - 145 mmol/L   Potassium 3.8 3.5 - 5.1 mmol/L   Chloride 111 98 - 111 mmol/L   CO2 22 22 - 32 mmol/L   Glucose, Bld 88 70 - 99 mg/dL    Comment: Glucose reference range applies only to samples taken after fasting for at least 8 hours.   BUN 9 8 - 23 mg/dL   Creatinine, Ser 0.10 0.61 - 1.24 mg/dL   Calcium 8.8 (L) 8.9 - 10.3 mg/dL   Total Protein 6.9 6.5 - 8.1 g/dL   Albumin 3.9 3.5 - 5.0 g/dL   AST 27 15 - 41 U/L   ALT 23 0 - 44 U/L   Alkaline Phosphatase 88 38 - 126 U/L   Total Bilirubin 1.0 0.3 - 1.2 mg/dL   GFR, Estimated 57 (L) >60 mL/min    Comment: (NOTE) Calculated using the CKD-EPI Creatinine Equation (2021)    Anion gap 8 5 - 15    Comment: Performed at [facility], [address] [place], [place] [address]  Ethanol     Status: Abnormal   Collection Time: 04/05/23  1:50 PM  Result Value Ref Range   Alcohol, Ethyl (B) 70 (H) <10 mg/dL    Comment: (NOTE) Lowest detectable limit for serum alcohol is 10 mg/dL.  For medical purposes only. Performed at [facility], [address] [place], [place] [address]   Rapid urine drug screen (hospital performed)     Status: None   Collection Time: 04/05/23  4:03 PM  Result Value Ref Range   Opiates NONE DETECTED NONE DETECTED   Cocaine NONE DETECTED NONE DETECTED   Benzodiazepines NONE DETECTED NONE DETECTED   Amphetamines NONE DETECTED NONE DETECTED   Tetrahydrocannabinol NONE DETECTED NONE DETECTED   Barbiturates NONE DETECTED NONE DETECTED    Comment: (NOTE) DRUG SCREEN FOR MEDICAL PURPOSES ONLY.  IF CONFIRMATION IS NEEDED FOR ANY PURPOSE, NOTIFY LAB WITHIN 5 DAYS.  LOWEST DETECTABLE LIMITS FOR URINE DRUG SCREEN Drug Class                      Cutoff (  ng/mL) Amphetamine and metabolites    1000 Barbiturate and metabolites    200 Benzodiazepine                 200 Opiates and metabolites        300 Cocaine and metabolites        300 THC                            50 Performed at [facility], [address] [place], [place] [address]     Current Facility-Administered Medications  Medication Dose Route Frequency Provider Last Rate Last Admin   risperiDONE (RISPERDAL M-TABS) disintegrating tablet 2 mg  2 mg Oral Q8H PRN Motley-Mangrum, Evalyn Shultis A, PMHNP       And   LORazepam (ATIVAN) tablet 1 mg  1 mg Oral PRN Motley-Mangrum, Harold Moncus A, PMHNP       And   ziprasidone (GEODON) injection 20 mg  20 mg Intramuscular PRN Motley-Mangrum, Jonathin Heinicke A, PMHNP       OLANZapine zydis (ZYPREXA) disintegrating tablet 5 mg  5 mg Oral BID Motley-Mangrum, Cael Worth A, PMHNP       No current outpatient medications on file.    Musculoskeletal: Strength & Muscle Tone: within normal limits Gait & Station: normal Patient leans: N/A   Psychiatric Specialty Exam: Presentation  General Appearance:  Bizarre; Disheveled  Eye Contact: Fleeting  Speech: Garbled; Other (comment) (patient has no teeth in, unable to understand speech)  Speech Volume: Normal  Handedness:No data recorded  Mood and Affect  Mood: Euphoric  Affect: Full Range   Thought Process  Thought Processes: Disorganized  Descriptions of Associations:Loose  Orientation:Other (comment) (unable to asses, patient would not answer)  Thought Content:Delusions; Illogical  History of Schizophrenia/Schizoaffective disorder:No data recorded Duration of Psychotic Symptoms:No data recorded Hallucinations:Hallucinations: Other (comment) (unable to asses, patient would not answer)  Ideas of Reference:Other (comment) (unable to asses, patient would not answer)  Suicidal Thoughts:Suicidal Thoughts: -- (unable to asses, patient would not  answer)  Homicidal Thoughts:Homicidal Thoughts: -- (unable to asses, patient would not answer)   Sensorium  Memory: Other (comment) (unable to asses, patient would not answer)  Judgment: Other (comment) (unable to asses, patient would not answer)  Insight: Other (comment) (unable to asses, patient would not answer)   Executive Functions  Concentration: Other (comment) (unable to asses, patient would not answer)  Attention Span: Poor (unable to asses, patient would not answer)  Recall: Other (comment) (unable to asses, patient would not answer)  Fund of Knowledge: Other (comment) (unable to asses, patient would not answer)  Language: Poor   Psychomotor Activity  Psychomotor Activity: Psychomotor Activity: Normal   Assets  Assets: Communication Skills    Sleep  Sleep: Sleep: -- (unable to asses, patient would not answer)   Physical Exam: Physical Exam Vitals and nursing note reviewed. Exam conducted with a chaperone present.  Neurological:     Mental Status: He is alert.  Psychiatric:        Attention and Perception: He is inattentive.        Mood and Affect: Affect is inappropriate.        Speech: Speech is slurred.        Behavior: Behavior is uncooperative.        Thought Content: Thought content is delusional.        Judgment: Judgment is inappropriate.     Comments: Incoherent speech, patient also does not  have his teeth in his mouth. Unable to assess thought content     Review of Systems  Psychiatric/Behavioral:  Positive for hallucinations.    Blood pressure 122/77, pulse 90, temperature 98.4 F (36.9 C), temperature source Oral, resp. rate 18, SpO2 100%. There is no height or weight on file to calculate BMI.   Medical Decision Making: Patient case review and discussed with Dr. Jannifer Franklin. Patient needs inpatient psychiatric admission for stabilization and treatment.    Disposition: Recommend psychiatric Inpatient admission.  Alona Bene, PMHNP 04/05/2023 6:21 PM

## 2023-04-05 NOTE — ED Triage Notes (Signed)
Patient found [address] in front of the [employer] without a shirt on speaking incoherent sentences. Patient can not tell [place] his name or birthday. Does not have any complaints.

## 2023-04-06 DIAGNOSIS — R4689 Other symptoms and signs involving appearance and behavior: Secondary | ICD-10-CM

## 2023-04-06 DIAGNOSIS — Z5900 Homelessness unspecified: Secondary | ICD-10-CM

## 2023-04-06 NOTE — ED Notes (Signed)
Patient discharged off unit to home per provider. Patient alert, calm, cooperative and no s/s of distress. Discharge information given to patient. Belongings given to patient .  Patient ambulatory off unit, escorted by RN. Bus pass given to patient for transportation.

## 2023-04-06 NOTE — Discharge Instructions (Signed)

## 2023-04-06 NOTE — Progress Notes (Signed)
[facility] ED Progress Note  04/06/2023 2:05 PM Blake Gray  MRN:  [id number]   Subjective:   Principal Problem: Psychosis [facility]) Diagnosis:  Principal Problem:   Psychosis (HCC) Active Problems:   Homelessness   ED Assessment Time Calculation: Start Time: 1100 Stop Time: 1115 Total Time in Minutes (Assessment Completion): 15   Subjective: On evaluation today, the patient is sitting on the side of his bed, eating breakfast. His appearance is appropriate for environment. His eye contact is minimal.  Speech is coherent, unclear at times, due to patient not having his dentures in, normal pace and normal volume. He is alert and oriented x3 to person, place, and situation. He reports his mood is "I am doing fine, ready to leave".  Affect is congruent with mood.  Thought process is coherent.  Thought content is slightly within normal limits. He denies auditory and visual hallucinations.  No indication that he is responding to internal stimuli during this assessment.  No delusions elicited during this assessment.  He denies suicidal ideations. He denies homicidal ideations.  After some any questions, patient becomes irritable, stating "I am homeless, I am eating and sleeping okay ", "why you keep asking me all these questions I am tired of answering your questions", then patient proceeds to try and walk out the door saying that he is ready to leave the hospital. Patient states he does not have any family or friends that need to be called for collateral.  Patient states he gets a check every month for disability and told provider "that's all you need to know".   Past Psychiatric History: altered mental status   [place] Scale:  Flowsheet Row ED from 04/05/2023 in [facility] Department at [facility]  C-SSRS RISK CATEGORY Error: Question 6 not populated       Past Medical History:  Past Medical History:  Diagnosis Date   No pertinent past medical history    No past  surgical history on file. Family History: No family history on file.  Social History:  Social History   Substance and Sexual Activity  Alcohol Use None     Social History   Substance and Sexual Activity  Drug Use Not on file    Social History   Socioeconomic History   Marital status: Single    Spouse name: Not on file   Number of children: Not on file   Years of education: Not on file   Highest education level: Not on file  Occupational History   Not on file  Tobacco Use   Smoking status: Not on file   Smokeless tobacco: Not on file  Substance and Sexual Activity   Alcohol use: Not on file   Drug use: Not on file   Sexual activity: Not on file  Other Topics Concern   Not on file  Social History Narrative   Not on file   Social Determinants of Health   Financial Resource Strain: Not on file  Food Insecurity: Not on file  Transportation Needs: Not on file  Physical Activity: Not on file  Stress: Not on file  Social Connections: Not on file    Sleep: Good  Appetite:  Good  Current Medications: Current Facility-Administered Medications  Medication Dose Route Frequency Provider Last Rate Last Admin   risperiDONE (RISPERDAL M-TABS) disintegrating tablet 2 mg  2 mg Oral Q8H PRN Motley-Mangrum, Doy Taaffe A, PMHNP       And   LORazepam (ATIVAN) tablet 1 mg  1 mg Oral PRN Motley-Mangrum, Rowan Blaker A, PMHNP       And   ziprasidone (GEODON) injection 20 mg  20 mg Intramuscular PRN Motley-Mangrum, Rolondo Pierre A, PMHNP       OLANZapine zydis (ZYPREXA) disintegrating tablet 5 mg  5 mg Oral BID Motley-Mangrum, Salif Tay A, PMHNP   5 mg at 04/06/23 0957   No current outpatient medications on file.    Lab Results:  Results for orders placed or performed during the hospital encounter of 04/05/23 (from the past 48 hour(s))  CBG monitoring, ED     Status: None   Collection Time: 04/05/23  1:38 PM  Result Value Ref Range   Glucose-Capillary 88 70 - 99 mg/dL    Comment: Glucose  reference range applies only to samples taken after fasting for at least 8 hours.  CBC with Differential     Status: Abnormal   Collection Time: 04/05/23  1:50 PM  Result Value Ref Range   WBC 4.7 4.0 - 10.5 K/uL   RBC 4.20 (L) 4.22 - 5.81 MIL/uL   Hemoglobin 13.0 13.0 - 17.0 g/dL   HCT 96.0 (L) 45.4 - 09.8 %   MCV 91.9 80.0 - 100.0 fL   MCH 31.0 26.0 - 34.0 pg   MCHC 33.7 30.0 - 36.0 g/dL   RDW 11.9 14.7 - 82.9 %   Platelets 257 150 - 400 K/uL   nRBC 0.0 0.0 - 0.2 %   Neutrophils Relative % 46 %   Neutro Abs 2.1 1.7 - 7.7 K/uL   Lymphocytes Relative 39 %   Lymphs Abs 1.8 0.7 - 4.0 K/uL   Monocytes Relative 8 %   Monocytes Absolute 0.4 0.1 - 1.0 K/uL   Eosinophils Relative 6 %   Eosinophils Absolute 0.3 0.0 - 0.5 K/uL   Basophils Relative 0 %   Basophils Absolute 0.0 0.0 - 0.1 K/uL   Immature Granulocytes 1 %   Abs Immature Granulocytes 0.04 0.00 - 0.07 K/uL    Comment: Performed at [facility], [address], [place], [place] [address]  Comprehensive metabolic panel     Status: Abnormal   Collection Time: 04/05/23  1:50 PM  Result Value Ref Range   Sodium 141 135 - 145 mmol/L   Potassium 3.8 3.5 - 5.1 mmol/L   Chloride 111 98 - 111 mmol/L   CO2 22 22 - 32 mmol/L   Glucose, Bld 88 70 - 99 mg/dL    Comment: Glucose reference range applies only to samples taken after fasting for at least 8 hours.   BUN 9 6 - 20 mg/dL    Comment: QA FLAGS AND/OR RANGES MODIFIED BY DEMOGRAPHIC UPDATE ON 10/05 AT 2053   Creatinine, Ser 0.88 0.61 - 1.24 mg/dL   Calcium 8.8 (L) 8.9 - 10.3 mg/dL   Total Protein 6.9 6.5 - 8.1 g/dL   Albumin 3.9 3.5 - 5.0 g/dL   AST 27 15 - 41 U/L   ALT 23 0 - 44 U/L   Alkaline Phosphatase 88 38 - 126 U/L   Total Bilirubin 1.0 0.3 - 1.2 mg/dL   GFR, Estimated 57 (L) >60 mL/min    Comment: (NOTE) Calculated using the CKD-EPI Creatinine Equation (2021)    Anion gap 8 5 - 15    Comment: Performed at [facility], [address] [place], [place] [address]  Ethanol     Status: Abnormal   Collection Time: 04/05/23  1:50 PM  Result Value Ref Range  Alcohol, Ethyl (B) 70 (H) <10 mg/dL    Comment: (NOTE) Lowest detectable limit for serum alcohol is 10 mg/dL.  For medical purposes only. Performed at [facility], [address] [place], [place] [address]   Rapid urine drug screen (hospital performed)     Status: None   Collection Time: 04/05/23  4:03 PM  Result Value Ref Range   Opiates NONE DETECTED NONE DETECTED   Cocaine NONE DETECTED NONE DETECTED   Benzodiazepines NONE DETECTED NONE DETECTED   Amphetamines NONE DETECTED NONE DETECTED   Tetrahydrocannabinol NONE DETECTED NONE DETECTED   Barbiturates NONE DETECTED NONE DETECTED    Comment: (NOTE) DRUG SCREEN FOR MEDICAL PURPOSES ONLY.  IF CONFIRMATION IS NEEDED FOR ANY PURPOSE, NOTIFY LAB WITHIN 5 DAYS.  LOWEST DETECTABLE LIMITS FOR URINE DRUG SCREEN Drug Class                     Cutoff (ng/mL) Amphetamine and metabolites    1000 Barbiturate and metabolites    200 Benzodiazepine                 200 Opiates and metabolites        300 Cocaine and metabolites        300 THC                            50 Performed at [facility], [address] [place], [place] [address]     Blood Alcohol level:  Lab Results  Component Value Date   ETH 13 (H) 04/05/2023    Physical Findings:  CIWA:    COWS:     Musculoskeletal: Strength & Muscle Tone: within normal limits Gait & Station: normal Patient leans: N/A  Psychiatric Specialty Exam:  Presentation  General Appearance:  Appropriate for Environment  Eye Contact: Fleeting  Speech: Garbled; Other (comment) (patient has no teeth in, unable to understand speech)  Speech Volume: Normal  Handedness:No data recorded  Mood and Affect  Mood: Euphoric  Affect: Full Range   Thought Process  Thought  Processes: Disorganized  Descriptions of Associations:Loose  Orientation:Other (comment) (unable to asses, patient would not answer)  Thought Content:Delusions; Illogical  History of Schizophrenia/Schizoaffective disorder:No data recorded Duration of Psychotic Symptoms:No data recorded Hallucinations:Hallucinations: Other (comment) (unable to asses, patient would not answer)  Ideas of Reference:Other (comment) (unable to asses, patient would not answer)  Suicidal Thoughts:Suicidal Thoughts: -- (unable to asses, patient would not answer)  Homicidal Thoughts:Homicidal Thoughts: -- (unable to asses, patient would not answer)   Sensorium  Memory: Other (comment) (unable to asses, patient would not answer)  Judgment: Other (comment) (unable to asses, patient would not answer)  Insight: Other (comment) (unable to asses, patient would not answer)   Executive Functions  Concentration: Other (comment) (unable to asses, patient would not answer)  Attention Span: Poor (unable to asses, patient would not answer)  Recall: Other (comment) (unable to asses, patient would not answer)  Fund of Knowledge: Other (comment) (unable to asses, patient would not answer)  Language: Poor   Psychomotor Activity  Psychomotor Activity: Psychomotor Activity: Normal   Assets  Assets: Communication Skills   Sleep  Sleep: Sleep: -- (unable to asses, patient would not answer)    Physical Exam: Physical Exam Vitals and nursing note reviewed. Exam conducted with a chaperone present.  Neurological:     Mental Status: He is alert.  Psychiatric:  Attention and Perception: Attention normal.        Mood and Affect: Affect is labile.        Speech: Speech normal.        Behavior: Behavior is cooperative.        Thought Content: Thought content normal.        Cognition and Memory: Cognition normal.    Review of Systems  Constitutional: Negative.   Psychiatric/Behavioral:  Negative.     Blood pressure 95/69, pulse 76, temperature 98.4 F (36.9 C), temperature source Oral, resp. rate 20, SpO2 100%. There is no height or weight on file to calculate BMI.   Medical Decision Making: Patient is psychiatrically cleared. Patient case review and discussed with Dr. Jannifer Franklin, and patient does not meet inpatient criteria for inpatient psychiatric treatment. At time of discharge, patient denies SI, HI, AVH and can contract for safety. He demonstrated no overt evidence of psychosis or mania. Prior to discharge, he verbalized that they understood warning signs, triggers, and symptoms of worsening mental health and how to access emergency mental health care if they felt it was needed. Patient was instructed to call 911 or return to the emergency room if they experienced any concerning symptoms after discharge. Patient voiced understanding and agreed to the above.  Patient given resources to follow up with behavioral health urgent care for therapy and medication management. Patient denies access to weapons.    Bearett Porcaro MOTLEY-MANGRUM, PMHNP 04/06/2023, 2:05 PM

## 2023-04-06 NOTE — ED Notes (Signed)
Patient alert. Patient calm. Patient cooperative with staff.  Patient ate breakfast.  Patient was medication compliant this AM.  No suicidal or homicidal ideation noted.  No delusion or paranoia noted.

## 2023-04-06 NOTE — ED Provider Notes (Signed)
Emergency Medicine Observation Re-evaluation Note  Blake Gray is a 57 y.o. male, seen on rounds today.  Pt initially presented to the ED for complaints of Altered Mental Status Currently, the patient is having auditory hallucinations  Physical Exam  BP 95/69 (BP Location: Left Arm)   Pulse 76   Temp 98.4 F (36.9 C) (Oral)   Resp 20   SpO2 100%  Physical Exam Alert and in no acute distress  ED Course / MDM  EKG:EKG Interpretation Date/Time:  Saturday April 05 2023 13:36:51 EDT Ventricular Rate:  103 PR Interval:  220 QRS Duration:  80 QT Interval:  339 QTC Calculation: 444 R Axis:   71  Text Interpretation: Sinus tachycardia Prolonged PR interval Abnormal R-wave progression, early transition no prior ECG for comparion. wandering baseline No STEMI Confirmed by Tegeler, Thayer Ohm ([id number]) on 04/05/2023 1:57:21 PM  I have reviewed the labs performed to date as well as medications administered while in observation.  Recent changes in the last 24 hours include none.  Plan  Current plan is for psychiatric admission.    Bethann Berkshire, MD 04/06/23 0730

## 2023-04-07 ENCOUNTER — Encounter (HOSPITAL_COMMUNITY): Payer: Self-pay | Admitting: *Deleted
